# Patient Record
Sex: Female | Born: 1970
Health system: Southern US, Community
[De-identification: ages and names within clinical notes are randomized; demographics above are authoritative.]

## PROBLEM LIST (undated history)

## (undated) DIAGNOSIS — Z9289 Personal history of other medical treatment: Secondary | ICD-10-CM

## (undated) DIAGNOSIS — D649 Anemia, unspecified: Secondary | ICD-10-CM

## (undated) DIAGNOSIS — E282 Polycystic ovarian syndrome: Secondary | ICD-10-CM

## (undated) DIAGNOSIS — I10 Essential (primary) hypertension: Secondary | ICD-10-CM

## (undated) DIAGNOSIS — C801 Malignant (primary) neoplasm, unspecified: Secondary | ICD-10-CM

## (undated) DIAGNOSIS — C541 Malignant neoplasm of endometrium: Secondary | ICD-10-CM

## (undated) DIAGNOSIS — Z8489 Family history of other specified conditions: Secondary | ICD-10-CM

## (undated) HISTORY — DX: Malignant (primary) neoplasm, unspecified: C80.1

## (undated) HISTORY — PX: WISDOM TOOTH EXTRACTION: SHX21

## (undated) HISTORY — PX: CORNEAL TRANSPLANT: SHX108

## (undated) HISTORY — DX: Malignant neoplasm of endometrium: C54.1

## (undated) HISTORY — PX: CHOLECYSTECTOMY: SHX55

---

## 2009-06-29 ENCOUNTER — Observation Stay (HOSPITAL_COMMUNITY): Admission: EM | Admit: 2009-06-29 | Discharge: 2009-06-29 | Payer: Self-pay | Admitting: Emergency Medicine

## 2009-09-01 ENCOUNTER — Encounter (HOSPITAL_BASED_OUTPATIENT_CLINIC_OR_DEPARTMENT_OTHER): Admission: RE | Admit: 2009-09-01 | Discharge: 2009-11-30 | Payer: Self-pay | Admitting: General Surgery

## 2009-10-24 ENCOUNTER — Ambulatory Visit: Payer: Self-pay | Admitting: Surgery

## 2009-10-24 ENCOUNTER — Ambulatory Visit: Admission: RE | Admit: 2009-10-24 | Discharge: 2009-10-24 | Payer: Self-pay | Admitting: General Surgery

## 2009-10-25 ENCOUNTER — Encounter (HOSPITAL_BASED_OUTPATIENT_CLINIC_OR_DEPARTMENT_OTHER): Payer: Self-pay | Admitting: General Surgery

## 2009-12-04 ENCOUNTER — Encounter (HOSPITAL_BASED_OUTPATIENT_CLINIC_OR_DEPARTMENT_OTHER): Admission: RE | Admit: 2009-12-04 | Discharge: 2010-01-17 | Payer: Self-pay | Admitting: Internal Medicine

## 2010-11-28 LAB — POCT I-STAT, CHEM 8
Creatinine, Ser: 0.4 mg/dL (ref 0.4–1.2)
HCT: 46 % (ref 36.0–46.0)
Hemoglobin: 15.6 g/dL — ABNORMAL HIGH (ref 12.0–15.0)
Potassium: 3.6 mEq/L (ref 3.5–5.1)
Sodium: 141 mEq/L (ref 135–145)
TCO2: 25 mmol/L (ref 0–100)

## 2010-11-28 LAB — DIFFERENTIAL
Basophils Absolute: 0 10*3/uL (ref 0.0–0.1)
Basophils Relative: 0 % (ref 0–1)
Eosinophils Absolute: 0.1 10*3/uL (ref 0.0–0.7)
Eosinophils Relative: 1 % (ref 0–5)
Lymphs Abs: 1.7 10*3/uL (ref 0.7–4.0)
Neutrophils Relative %: 72 % (ref 43–77)

## 2010-11-28 LAB — CBC
HCT: 43.6 % (ref 36.0–46.0)
MCHC: 33.9 g/dL (ref 30.0–36.0)
MCV: 87.5 fL (ref 78.0–100.0)
Platelets: 247 10*3/uL (ref 150–400)
RDW: 13.5 % (ref 11.5–15.5)
WBC: 8.9 10*3/uL (ref 4.0–10.5)

## 2010-11-28 LAB — GLUCOSE, CAPILLARY: Glucose-Capillary: 118 mg/dL — ABNORMAL HIGH (ref 70–99)

## 2014-04-26 ENCOUNTER — Encounter (HOSPITAL_COMMUNITY): Payer: Self-pay | Admitting: Emergency Medicine

## 2014-04-26 ENCOUNTER — Emergency Department (HOSPITAL_COMMUNITY): Payer: BC Managed Care – PPO

## 2014-04-26 ENCOUNTER — Inpatient Hospital Stay (HOSPITAL_COMMUNITY)
Admission: EM | Admit: 2014-04-26 | Discharge: 2014-04-28 | DRG: 812 | Disposition: A | Discharge status: Home / Self Care | Payer: BC Managed Care – PPO | Attending: Family Medicine | Admitting: Family Medicine

## 2014-04-26 DIAGNOSIS — N920 Excessive and frequent menstruation with regular cycle: Secondary | ICD-10-CM | POA: Diagnosis present

## 2014-04-26 DIAGNOSIS — Z9289 Personal history of other medical treatment: Secondary | ICD-10-CM

## 2014-04-26 DIAGNOSIS — I872 Venous insufficiency (chronic) (peripheral): Secondary | ICD-10-CM

## 2014-04-26 DIAGNOSIS — D649 Anemia, unspecified: Secondary | ICD-10-CM

## 2014-04-26 DIAGNOSIS — Z803 Family history of malignant neoplasm of breast: Secondary | ICD-10-CM

## 2014-04-26 DIAGNOSIS — D630 Anemia in neoplastic disease: Secondary | ICD-10-CM | POA: Diagnosis present

## 2014-04-26 DIAGNOSIS — D62 Acute posthemorrhagic anemia: Principal | ICD-10-CM | POA: Diagnosis present

## 2014-04-26 DIAGNOSIS — I10 Essential (primary) hypertension: Secondary | ICD-10-CM | POA: Diagnosis present

## 2014-04-26 DIAGNOSIS — Z8249 Family history of ischemic heart disease and other diseases of the circulatory system: Secondary | ICD-10-CM

## 2014-04-26 DIAGNOSIS — I498 Other specified cardiac arrhythmias: Secondary | ICD-10-CM | POA: Diagnosis present

## 2014-04-26 DIAGNOSIS — E282 Polycystic ovarian syndrome: Secondary | ICD-10-CM | POA: Diagnosis present

## 2014-04-26 DIAGNOSIS — R0789 Other chest pain: Secondary | ICD-10-CM

## 2014-04-26 DIAGNOSIS — Z6841 Body Mass Index (BMI) 40.0 and over, adult: Secondary | ICD-10-CM

## 2014-04-26 HISTORY — DX: Personal history of other medical treatment: Z92.89

## 2014-04-26 HISTORY — DX: Essential (primary) hypertension: I10

## 2014-04-26 LAB — COMPREHENSIVE METABOLIC PANEL
ALBUMIN: 3.3 g/dL — AB (ref 3.5–5.2)
ALT: 7 U/L (ref 0–35)
ANION GAP: 16 — AB (ref 5–15)
AST: 10 U/L (ref 0–37)
Alkaline Phosphatase: 51 U/L (ref 39–117)
BILIRUBIN TOTAL: 0.3 mg/dL (ref 0.3–1.2)
BUN: 13 mg/dL (ref 6–23)
CHLORIDE: 104 meq/L (ref 96–112)
CO2: 21 mEq/L (ref 19–32)
CREATININE: 1 mg/dL (ref 0.50–1.10)
Calcium: 8.6 mg/dL (ref 8.4–10.5)
GFR, EST AFRICAN AMERICAN: 79 mL/min — AB (ref >90)
GFR, EST NON AFRICAN AMERICAN: 68 mL/min — AB (ref >90)
Glucose, Bld: 118 mg/dL — ABNORMAL HIGH (ref 70–99)
Potassium: 3.9 mEq/L (ref 3.7–5.3)
Sodium: 141 mEq/L (ref 137–147)
Total Protein: 6.7 g/dL (ref 6.0–8.3)

## 2014-04-26 LAB — CBC
HEMATOCRIT: 16.4 % — AB (ref 36.0–46.0)
Hemoglobin: 4.2 g/dL — CL (ref 12.0–15.0)
MCH: 17 pg — ABNORMAL LOW (ref 26.0–34.0)
MCHC: 25.6 g/dL — ABNORMAL LOW (ref 30.0–36.0)
MCV: 66.4 fL — AB (ref 78.0–100.0)
Platelets: 188 10*3/uL (ref 150–400)
RBC: 2.47 MIL/uL — ABNORMAL LOW (ref 3.87–5.11)
RDW: 19.9 % — AB (ref 11.5–15.5)
WBC: 9.3 10*3/uL (ref 4.0–10.5)

## 2014-04-26 LAB — PROTIME-INR
INR: 1.19 (ref 0.00–1.49)
Prothrombin Time: 15.1 seconds (ref 11.6–15.2)

## 2014-04-26 LAB — RETICULOCYTES
RBC.: 2.49 MIL/uL — ABNORMAL LOW (ref 3.87–5.11)
RETIC COUNT ABSOLUTE: 84.7 10*3/uL (ref 19.0–186.0)
Retic Ct Pct: 3.4 % — ABNORMAL HIGH (ref 0.4–3.1)

## 2014-04-26 LAB — I-STAT TROPONIN, ED: TROPONIN I, POC: 0 ng/mL (ref 0.00–0.08)

## 2014-04-26 LAB — PREPARE RBC (CROSSMATCH)

## 2014-04-26 LAB — ABO/RH: ABO/RH(D): A POS

## 2014-04-26 LAB — POC OCCULT BLOOD, ED: Fecal Occult Bld: POSITIVE — AB

## 2014-04-26 LAB — APTT: APTT: 29 s (ref 24–37)

## 2014-04-26 MED ORDER — FUROSEMIDE 10 MG/ML IJ SOLN
20.0000 mg | Freq: Once | INTRAMUSCULAR | Status: AC
  Administered 2014-04-27: 20 mg via INTRAVENOUS
  Filled 2014-04-26: qty 2

## 2014-04-26 MED ORDER — SODIUM CHLORIDE 0.9 % IV SOLN
Freq: Once | INTRAVENOUS | Status: AC
  Administered 2014-04-26: 23:00:00 via INTRAVENOUS

## 2014-04-26 MED ORDER — ONDANSETRON HCL 4 MG PO TABS: 4.0000 mg | ORAL_TABLET | Freq: Four times a day (QID) | ORAL | Status: DC | PRN

## 2014-04-26 MED ORDER — SENNOSIDES-DOCUSATE SODIUM 8.6-50 MG PO TABS
1.0000 | ORAL_TABLET | Freq: Every evening | ORAL | Status: DC | PRN
  Filled 2014-04-26: qty 1

## 2014-04-26 MED ORDER — ONDANSETRON HCL 4 MG/2ML IJ SOLN: 4.0000 mg | Freq: Four times a day (QID) | INTRAMUSCULAR | Status: DC | PRN

## 2014-04-26 NOTE — H&P (Signed)
[facility] Admission History and Physical Service Pager: [phone]  Patient name: Alexandra Morales Medical record number: [id number] Date of birth: February 07, 1971 Age: 43 y.o. Gender: female  Primary Care Provider: No PCP Per Patient Consultants: none Code Status: full   Chief Complaint: acute anemia   Assessment and Plan: Alexandra Morales is a 43 y.o. female presenting with anemia . PMH is significant for HTN.   #Microcytic anemia: Most likely acute blood loss associated with Menometrorrhagia. Reports menstrual cycles alternating in 3 months intervals. They are usually heavy but this has been the heaviest it has been. Hemoglobin of 4.2 with only comparison of 15. Patient with positive Hemoccult stool card. Reports of bright red blood with straining with stool. Denies any melena or hemoptysis. - Admitted to telemetry, Dr. Mingo Amber attending - Type and screen - Transfuse 3 units red blood cells - Post transfusion CBC - Lasix between transfusions - Iron and TIBC  - Ferritin - PT/INR, APTT  #Chest tightness/flow murmur/sinus tachycardia: Most likely associated with severe anemia. Istat trop negative and EKG showing sinus tachycardia. Patient is otherwise asymptomatic while lying down. -Cycle troponins x2 - TSH, hemoglobin A1c,  #Morbid obesity: BMI 74.4 -Consult to nutrition  #Lower extremity edema: Most likely related to venous insufficiency. No signs of fluid overload on chest x-ray area and no dyspnea. Only occurs at the end of the day while she is walking around at work. -Compression hoses -Elevate legs  FEN/GI: heart healthy diet, receiving blood  Prophylaxis: SCD's   Disposition: admitted to telemetry for PRC's.   History of Present Illness: Alexandra Morales is a 43 y.o. female presenting with dizziness and chest tightness. Her symptoms started on Sunday where she was becoming dizzy. Her dizziness only occurred when she was up waking around. Her symptoms  relieving upon sitting down. She had associated chest tightness only when walking around and relieved with rest. She's never had this chest tightness before. She denies any diaphoresis or nausea or vomiting. Father passed away at the age of 23 from heart attack.  She notes that she had a heavier menstrual cycle this past time and ordinary. It started last Wednesday and ended on Sunday. She is using roughly 2 pads an hour for 4 consecutive days. Normally she uses 3 pads per day during a normal menstrual cycle. She has never been pregnant. Her regular menstrual cycle is having a period for 3 months, no period for 3 months, period for 3 months and then no period for 3 months. She denies any abdominal or pelvic pain. She has never had a pelvic exam. Mother passed away from breast cancer.  She denies any hemoptysis. She denies any melena but does endorse prior red blood per act him. Red blood only occurs with stools. She denies any constipation or diarrhea.  Patient evaluated in the ED and found to have a hemoglobin of 4.2. Currently asymptomatic in the ED but is lying down.  Review Of Systems: Per HPI with the following additions: No headache, no abdominal pain, change in urination, see history of present illness Otherwise 12 point review of systems was performed and was unremarkable.  There are no active problems to display for this patient.  Past Medical History: Past Medical History  Diagnosis Date  . Hypertension    Past Surgical History: Past Surgical History  Procedure Laterality Date  . Cholecystectomy     Social History: History  Substance Use Topics  . Smoking status: Never Smoker   . Smokeless tobacco:  Not on file  . Alcohol Use: No   Additional social history: Never smoked, occasionally drink alcohol, never used illicit drugs Please also refer to relevant sections of EMR.  Family History: No family history on file. Allergies and Medications: No Known Allergies No current  facility-administered medications on file prior to encounter.   No current outpatient prescriptions on file prior to encounter.    Objective: BP 128/51  Pulse 99  Temp(Src) 98.5 F (36.9 C)  Resp 22  Ht 5\' 6"  (1.676 m)  Wt 460 lb (208.655 kg)  BMI 74.28 kg/m2  SpO2 92%  LMP 04/20/2014 Exam: General: NAD, alert, morbidly obese, pallor  HEENT: pale conjunctiva, [place]/AT,  Cardiovascular: tachycardic, murmur, S1S2,  Respiratory: CTAB, no wheezing or crackles, no extra effort of breathing  Abdomen: protuberant abdomen, soft, no hepatosplenomegaly, bowel sounds present Extremities: +2 pitting edema in to the knees bilaterally, venous stasis ulcer on the posterior right lower leg, no oozing or draining. Rectal: No external hemorrhoids noticed, no internal hemorrhoids palpated, no signs of anal fissure, dry blood noticed on skin and pelvic region Skin: No rashes Neuro: No gross deficits  Labs and Imaging: CBC BMET   Recent Labs Lab 04/26/14 1919  WBC 9.3  HGB 4.2*  HCT 16.4*  PLT 188    Recent Labs Lab 04/26/14 1919  NA 141  K 3.9  CL 104  CO2 21  BUN 13  CREATININE 1.00  GLUCOSE 118*  CALCIUM 8.6     EKG: sinus tachycardia   Chest x-ray IMPRESSION:  1. Stable cardiomegaly. No acute disease.  Alexandra Ax, MD 04/26/2014, 9:22 PM PGY-2, [place] Intern pager: [phone], text pages welcome

## 2014-04-26 NOTE — ED Provider Notes (Signed)
CSN: [id number]     Arrival date & time 04/26/14  1852 History   First MD Initiated Contact with Patient 04/26/14 1954     Chief Complaint  Patient presents with  . Chest Pain     (Consider location/radiation/quality/duration/timing/severity/associated sxs/prior Treatment) HPI Complains of generalized weakness, dyspnea on exertion and chest discomfort with exertion described as anterior tightness onset 2 days ago. Symptoms improved with rest. No treatment prior to coming here. No other associated symptoms. She is presently asymptomatic. She admits to heavy menses. Denies blood per rectum.  Past Medical History  Diagnosis Date  . Hypertension    Past Surgical History  Procedure Laterality Date  . Cholecystectomy     No family history on file. History  Substance Use Topics  . Smoking status: Never Smoker   . Smokeless tobacco: Not on file  . Alcohol Use: No   OB History   Grav Para Term Preterm Abortions TAB SAB Ect Mult Living                 Review of Systems  Constitutional: Positive for fatigue.  HENT: Negative.   Respiratory: Positive for shortness of breath.   Cardiovascular: Positive for chest pain.  Gastrointestinal: Negative.   Genitourinary:       Heavy menses  Musculoskeletal: Negative.   Skin: Negative.   Neurological: Negative.   Psychiatric/Behavioral: Negative.   All other systems reviewed and are negative.     Allergies  Review of patient's allergies indicates no known allergies.  Home Medications   Prior to Admission medications   Not on File   BP 128/51  Pulse 99  Temp(Src) 98.5 F (36.9 C)  Resp 22  Ht 5\' 6"  (1.676 m)  Wt 460 lb (208.655 kg)  BMI 74.28 kg/m2  SpO2 92%  LMP 04/20/2014 Physical Exam  Nursing note and vitals reviewed. Constitutional: She appears well-developed and well-nourished.  HENT:  Head: Normocephalic and atraumatic.  Eyes: Conjunctivae are normal. Pupils are equal, round, and reactive to light.  Neck: Neck  supple. No tracheal deviation present. No thyromegaly present.  Cardiovascular: Normal rate and regular rhythm.   No murmur heard. Pulmonary/Chest: Effort normal and breath sounds normal.  Abdominal: Soft. Bowel sounds are normal. She exhibits no distension. There is no tenderness.  Morbidly obese  Musculoskeletal: Normal range of motion. She exhibits no edema and no tenderness.  Neurological: She is alert. Coordination normal.  Skin: Skin is warm and dry. No rash noted.  Psychiatric: She has a normal mood and affect.    ED Course  Procedures (including critical care time) Labs Review Labs Reviewed  CBC - Abnormal; Notable for the following:    RBC 2.47 (*)    Hemoglobin 4.2 (*)    HCT 16.4 (*)    MCV 66.4 (*)    MCH 17.0 (*)    MCHC 25.6 (*)    RDW 19.9 (*)    All other components within normal limits  COMPREHENSIVE METABOLIC PANEL - Abnormal; Notable for the following:    Glucose, Bld 118 (*)    Albumin 3.3 (*)    GFR calc non Af Amer 68 (*)    GFR calc Af Amer 79 (*)    Anion gap 16 (*)    All other components within normal limits  I-STAT TROPOININ, ED    Imaging Review Dg Chest 2 View  04/26/2014   CLINICAL DATA:  CHEST PAIN CHEST PAIN a  EXAM: CHEST - 2 VIEW  COMPARISON:  06/28/2009  FINDINGS: Stable cardiomegaly.  Lungs clear.  No pneumothorax.  No effusion. Visualized skeletal structures are unremarkable.  IMPRESSION: 1. Stable cardiomegaly.  No acute disease.   Electronically Signed   By: Arne Cleveland M.D.   On: 04/26/2014 20:27     EKG Interpretation   Date/Time:  Tuesday April 26 2014 19:12:21 EDT Ventricular Rate:  116 PR Interval:  152 QRS Duration: 92 QT Interval:  334 QTC Calculation: 464 R Axis:   58 Text Interpretation:  Sinus tachycardia Otherwise normal ECG No old  tracing to compare Confirmed by Winfred Leeds  MD, Fernie Grimm [phone]) on 04/26/2014  7:35:52 PM     Results for orders placed during the hospital encounter of 04/26/14  CBC      Result  Value Ref Range   WBC 9.3  4.0 - 10.5 K/uL   RBC 2.47 (*) 3.87 - 5.11 MIL/uL   Hemoglobin 4.2 (*) 12.0 - 15.0 g/dL   HCT 16.4 (*) 36.0 - 46.0 %   MCV 66.4 (*) 78.0 - 100.0 fL   MCH 17.0 (*) 26.0 - 34.0 pg   MCHC 25.6 (*) 30.0 - 36.0 g/dL   RDW 19.9 (*) 11.5 - 15.5 %   Platelets 188  150 - 400 K/uL  COMPREHENSIVE METABOLIC PANEL      Result Value Ref Range   Sodium 141  137 - 147 mEq/L   Potassium 3.9  3.7 - 5.3 mEq/L   Chloride 104  96 - 112 mEq/L   CO2 21  19 - 32 mEq/L   Glucose, Bld 118 (*) 70 - 99 mg/dL   BUN 13  6 - 23 mg/dL   Creatinine, Ser 1.00  0.50 - 1.10 mg/dL   Calcium 8.6  8.4 - 10.5 mg/dL   Total Protein 6.7  6.0 - 8.3 g/dL   Albumin 3.3 (*) 3.5 - 5.2 g/dL   AST 10  0 - 37 U/L   ALT 7  0 - 35 U/L   Alkaline Phosphatase 51  39 - 117 U/L   Total Bilirubin 0.3  0.3 - 1.2 mg/dL   GFR calc non Af Amer 68 (*) >90 mL/min   GFR calc Af Amer 79 (*) >90 mL/min   Anion gap 16 (*) 5 - 15  I-STAT TROPOININ, ED      Result Value Ref Range   Troponin i, poc 0.00  0.00 - 0.08 ng/mL   Comment 3            Dg Chest 2 View  04/26/2014   CLINICAL DATA:  CHEST PAIN CHEST PAIN a  EXAM: CHEST - 2 VIEW  COMPARISON:  06/28/2009  FINDINGS: Stable cardiomegaly.  Lungs clear.  No pneumothorax.  No effusion. Visualized skeletal structures are unremarkable.  IMPRESSION: 1. Stable cardiomegaly.  No acute disease.   Electronically Signed   By: Arne Cleveland M.D.   On: 04/26/2014 20:27    MDM  With Dr.Scmmitz plan 23 hour observation MedSurg floor Transfuse with packed red cells Diagnosis symptomatic anemia Final diagnoses:  None        Orlie Dakin, MD 04/26/14 2130

## 2014-04-26 NOTE — ED Notes (Signed)
Pt presents with chest tightness, shortness of breath, and dizziness since Sunday.  Pt noticed on Sunday that she was feeling short of breath and that her heart was racing- denies pain but admits to tightness in chest with activity.  Pt reports dizziness with standing and "feeling like the room is spinning".  Neuro exam negative.  Pt alert and oriented X 4.

## 2014-04-27 ENCOUNTER — Inpatient Hospital Stay (HOSPITAL_COMMUNITY): Payer: BC Managed Care – PPO

## 2014-04-27 DIAGNOSIS — I872 Venous insufficiency (chronic) (peripheral): Secondary | ICD-10-CM

## 2014-04-27 DIAGNOSIS — D62 Acute posthemorrhagic anemia: Secondary | ICD-10-CM | POA: Diagnosis present

## 2014-04-27 DIAGNOSIS — D649 Anemia, unspecified: Secondary | ICD-10-CM

## 2014-04-27 DIAGNOSIS — Z6841 Body Mass Index (BMI) 40.0 and over, adult: Secondary | ICD-10-CM | POA: Diagnosis not present

## 2014-04-27 DIAGNOSIS — I10 Essential (primary) hypertension: Secondary | ICD-10-CM | POA: Diagnosis present

## 2014-04-27 DIAGNOSIS — R0789 Other chest pain: Secondary | ICD-10-CM

## 2014-04-27 DIAGNOSIS — Z803 Family history of malignant neoplasm of breast: Secondary | ICD-10-CM | POA: Diagnosis not present

## 2014-04-27 DIAGNOSIS — I498 Other specified cardiac arrhythmias: Secondary | ICD-10-CM | POA: Diagnosis present

## 2014-04-27 DIAGNOSIS — Z8249 Family history of ischemic heart disease and other diseases of the circulatory system: Secondary | ICD-10-CM | POA: Diagnosis not present

## 2014-04-27 DIAGNOSIS — N920 Excessive and frequent menstruation with regular cycle: Secondary | ICD-10-CM | POA: Diagnosis present

## 2014-04-27 DIAGNOSIS — E282 Polycystic ovarian syndrome: Secondary | ICD-10-CM | POA: Diagnosis present

## 2014-04-27 LAB — PREGNANCY, URINE: PREG TEST UR: NEGATIVE

## 2014-04-27 LAB — TROPONIN I: Troponin I: <0.3 ng/mL (ref <0.30)

## 2014-04-27 LAB — CBC
HEMATOCRIT: 26.2 % — AB (ref 36.0–46.0)
HEMOGLOBIN: 7.8 g/dL — AB (ref 12.0–15.0)
MCH: 21.1 pg — AB (ref 26.0–34.0)
MCHC: 29.8 g/dL — ABNORMAL LOW (ref 30.0–36.0)
MCV: 71 fL — ABNORMAL LOW (ref 78.0–100.0)
Platelets: 214 10*3/uL (ref 150–400)
RBC: 3.69 MIL/uL — AB (ref 3.87–5.11)
RDW: 22.6 % — ABNORMAL HIGH (ref 11.5–15.5)
WBC: 11 10*3/uL — ABNORMAL HIGH (ref 4.0–10.5)

## 2014-04-27 LAB — IRON AND TIBC
IRON: 10 ug/dL — AB (ref 42–135)
SATURATION RATIOS: 2 % — AB (ref 20–55)
TIBC: 426 ug/dL (ref 250–470)
UIBC: 416 ug/dL — AB (ref 125–400)

## 2014-04-27 LAB — HEMOGLOBIN AND HEMATOCRIT, BLOOD
HCT: 20.3 % — ABNORMAL LOW (ref 36.0–46.0)
Hemoglobin: 5.9 g/dL — CL (ref 12.0–15.0)

## 2014-04-27 LAB — HEMOGLOBIN A1C
Hgb A1c MFr Bld: 5.7 % — ABNORMAL HIGH (ref <5.7)
MEAN PLASMA GLUCOSE: 117 mg/dL — AB (ref <117)

## 2014-04-27 LAB — TSH: TSH: 4.02 u[IU]/mL (ref 0.350–4.500)

## 2014-04-27 LAB — PREPARE RBC (CROSSMATCH)

## 2014-04-27 LAB — FERRITIN: Ferritin: 2 ng/mL — ABNORMAL LOW (ref 10–291)

## 2014-04-27 MED ORDER — PANTOPRAZOLE SODIUM 40 MG IV SOLR
40.0000 mg | Freq: Two times a day (BID) | INTRAVENOUS | Status: DC
  Administered 2014-04-27 – 2014-04-28 (×3): 40 mg via INTRAVENOUS
  Filled 2014-04-27 (×4): qty 40

## 2014-04-27 MED ORDER — SODIUM CHLORIDE 0.9 % IV SOLN
Freq: Once | INTRAVENOUS | Status: AC
Start: 2014-04-27 — End: 2014-04-27
  Administered 2014-04-27: 15:00:00 via INTRAVENOUS

## 2014-04-27 MED ORDER — PANTOPRAZOLE SODIUM 40 MG PO TBEC: 40.0000 mg | DELAYED_RELEASE_TABLET | Freq: Two times a day (BID) | ORAL | Status: DC

## 2014-04-27 NOTE — H&P (Signed)
FMTS Attending Admission Note: Alexandra Sabal MD Personal pager:  [phone] FPTS Service Pager:  [phone]  I  have seen and examined this patient, reviewed their chart. I have discussed this patient with the resident. I agree with the resident's findings, assessment and care plan.  Additionally:  43 yo morbidly obese F with PMH significant for irregular periods and HTN who presents with symptomatic anemia of past several day's duration.  Patient states she has had irregular periods for past 2-3 years, described as light and heavy cycles alternating with months of no menses.  Last week, started period.  Increased to heavier than usual bleeding 2-4 pads per hour.  Began having lightheadedness and chest tightness when she walked, relieved with rest.  Denies orthopnea or PND, now or in past.  Bleeding stopped Monday but lightheadedness persisted thus she came to ED.  FOund to have Hgb ~4.  Admitted to [facility].  Exam: Gen:  Obese, pleasant, NAD Heart: RRR Lungs:  Distant but clear Abd: NT Ext:  Chronic venous stasis changes noted posterior portions of both legs, worse at calves.  +2 edema BL  Imp/Plan: 1.  Anemia - previously symptomatic, reason for admission - seems to be secondary to menorrhagia.   - she denied any melena or hematochezia to me.  Possibly FOBT + from contamination during examination - receiving her last unit of blood now.  Await post-transfusion CBC  2.  Menorrhagia: - cause of #1.   - would suspect this is hormonal rather than structural bleeding - recommend [place] prior to DC as she has not followed up often with physicians outpatient.   - no weight loss, night sweats, or other concerns for malignancy  3.  Cardiomegaly: - Likely Stage I HF based on risk of obesity and HTN - can defer Echo to outpatient as no symptoms prior to anemic episode.  Denies orthopnea as above.  4.  Chest tightness - agree with ACS R/O  Alveda Reasons, MD 04/27/2014 8:03 AM

## 2014-04-27 NOTE — Progress Notes (Signed)
Family Medicine Teaching Service Daily Progress Note Intern Pager: [phone]  Patient name: Alexandra Morales Medical record number: [id number] Date of birth: 15-Jun-1971 Age: 43 y.o. Gender: female  Primary Care Provider: No PCP Per Patient Consultants: None Code Status: Full  Pt Overview and Major Events to Date:  04/26/14:  Admitted to Hospital. Received 3 units of blood.  Assessment and Plan: Mrs. Redel is a 43yo female with history of HTN presenting with dizziness and chest tightness secondary to anemia. #Microcytic Anemia -Secondary to acute blood loss secondary to Menometorrhagia -Transfused 3 units RBC and received Lasix between transfusions -Hemoglobin pending from today, 4.2 yesterday prior to transfusion  -Monitor CBC -Manual Retic Count- 84.7 -Labs Pending:  Iron, TIBC, Ferritin, PT/INR, APTT, Troponins, TSH, A1C, Pregnancy Test -CXR- stable cardiomegaly, no acute disease -+ FOBT -Resulted in Chest Tightness, Flow Murmur, and Sinus Tachycardia  -Initial Troponin negative, continue to cycle troponins and monitor  -Sinus Tachycardia -Follow-up Pelvic US  #Morbid Obesity -BMI=74.4 -Consult Nutrition  #Lower Extremity Edema -Secondary to venous insufficiency -Compression hose and elevate legs  FEN/GI: Heart Healthy Diet, Transfused 3units RBC PPx: SCDs  Disposition:  -Admitted to telemetry.  Subjective:  No acute complaints overnight.  States she is feeling much better than yesterday when she was admitted to the hospital.  Denies problems with appetite or urination.  No bowel movement since hospitalization.  Denies being sexually active.  She is not currently on birth control.  No recent history of blood in stool. Normally uses 3 pads/day during normal menstrual cycle.  Cycle normally alternates at three month intervals, with one period a month for three months and then no periods for three months.  Last menstrual periods started on Wednesday (04/20/14) and ended on Sunday  (04/24/14) and she was using approximately 2 pads an hour.  Objective: Temp:  [98.1 F (36.7 C)-99.4 F (37.4 C)] 98.3 F (36.8 C) (09/02 0800) Pulse Rate:  [80-106] 80 (09/02 0800) Resp:  [17-23] 18 (09/02 0800) BP: (128-175)/(41-68) 158/59 mmHg (09/02 0800) SpO2:  [92 %-100 %] 94 % (09/02 0723) Weight:  [460 lb (208.655 kg)-467 lb 9.5 oz (212.1 kg)] 467 lb 9.5 oz (212.1 kg) (09/02 8341) Physical Exam: General: 43yo female resting comfortably and in no apparent distress Cardiovascular: S1 and S2 noted; no murmurs; regular rate and rhythm Respiratory: Clear to auscultation bilaterally; no wheezing noted; no increased work of breathing Abdomen: Bowel sounds noted; no tenderness or masses to palpation; soft and nondistended Extremities: Edema noted bilaterally--patient states this is at baseline  Laboratory:  Recent Labs Lab 04/26/14 1919  WBC 9.3  HGB 4.2*  HCT 16.4*  PLT 188    Recent Labs Lab 04/26/14 1919  NA 141  K 3.9  CL 104  CO2 21  BUN 13  CREATININE 1.00  CALCIUM 8.6  PROT 6.7  BILITOT 0.3  ALKPHOS 51  ALT 7  AST 10  GLUCOSE 118*   Troponins- negative, still cycling and monitoring  Imaging/Diagnostic Tests: EKG- sinus tachycardia CXR- stable cardiomegaly  Lorna Few, DO 04/27/2014, 9:40 AM PGY-1, [place] Intern pager: [phone], text pages welcome

## 2014-04-27 NOTE — Progress Notes (Signed)
UR Completed.  [phone]  

## 2014-04-27 NOTE — Progress Notes (Signed)
FMTS Attending Note  The plan of care was discussed with the resident team. I agree with the assessment and plan as documented by the resident.   Demetries Coia MD 

## 2014-04-27 NOTE — Plan of Care (Cosign Needed)
Problem: Food- and Nutrition-Related Knowledge Deficit (NB-1.1) Goal: Nutrition education Formal process to instruct or train a patient/client in a skill or to impart knowledge to help patients/clients voluntarily manage or modify food choices and eating behavior to maintain or improve health. Outcome: Completed/Met Date Met:  04/27/14  RD consulted for nutrition education regarding weight loss.  Body mass index is 75.51 kg/(m^2). Pt meets criteria for morbid obesity based on current BMI.  RD provided "Weight Loss Tips" handout from the Academy of [employer] and [employer]etics. Emphasized the importance of serving sizes and provided examples of correct portions of common foods. Discussed importance of controlled and consistent intake throughout the day. Provided examples of ways to balance meals/snacks and encouraged intake of high-fiber, whole grain complex carbohydrates. Emphasized the importance of hydration with calorie-free beverages and limiting sugar-sweetened beverages. Encouraged pt to discuss physical activity options with physician. Teach back method used.  Expect fair compliance.  Pt reports adequate appetite and PO intake. Pt states that she likes plenty of non-starchy vegetables and fruits, she just needs to incorporate them more in her diet. Discussed MyPlate with patient, emphasizing half the plate should be non-starchy vegetables. Pt reports drinking water mostly with Simply Lemonades occasionally. Pt states that she is walking more with her new position and is more aware of her food choices. Shared information regarding outpatient diet education at [facility].  Current diet order is regular, patient is consuming approximately 100% of meals at this time. Labs and medications reviewed. No further nutrition interventions warranted at this time. RD contact information provided. If additional nutrition issues arise, please re-consult RD.  Clayton Bibles, [place], [place] Licensed Dietitian  Nutritionist Pager: [phone]

## 2014-04-28 ENCOUNTER — Other Ambulatory Visit: Payer: Self-pay | Admitting: Family Medicine

## 2014-04-28 DIAGNOSIS — E282 Polycystic ovarian syndrome: Secondary | ICD-10-CM

## 2014-04-28 DIAGNOSIS — D62 Acute posthemorrhagic anemia: Secondary | ICD-10-CM

## 2014-04-28 LAB — TYPE AND SCREEN
ABO/RH(D): A POS
ANTIBODY SCREEN: NEGATIVE
UNIT DIVISION: 0
UNIT DIVISION: 0
Unit division: 0
Unit division: 0
Unit division: 0

## 2014-04-28 LAB — CBC
HEMATOCRIT: 24.8 % — AB (ref 36.0–46.0)
HEMOGLOBIN: 7.2 g/dL — AB (ref 12.0–15.0)
MCH: 20.8 pg — AB (ref 26.0–34.0)
MCHC: 29 g/dL — AB (ref 30.0–36.0)
MCV: 71.7 fL — ABNORMAL LOW (ref 78.0–100.0)
Platelets: 211 10*3/uL (ref 150–400)
RBC: 3.46 MIL/uL — ABNORMAL LOW (ref 3.87–5.11)
RDW: 23.1 % — ABNORMAL HIGH (ref 11.5–15.5)
WBC: 7.2 10*3/uL (ref 4.0–10.5)

## 2014-04-28 LAB — BASIC METABOLIC PANEL
Anion gap: 14 (ref 5–15)
BUN: 11 mg/dL (ref 6–23)
CO2: 22 meq/L (ref 19–32)
Calcium: 8.6 mg/dL (ref 8.4–10.5)
Chloride: 105 mEq/L (ref 96–112)
Creatinine, Ser: 0.88 mg/dL (ref 0.50–1.10)
GFR calc Af Amer: >90 mL/min (ref >90)
GFR calc non Af Amer: 80 mL/min — ABNORMAL LOW (ref >90)
Glucose, Bld: 119 mg/dL — ABNORMAL HIGH (ref 70–99)
Potassium: 5 mEq/L (ref 3.7–5.3)
SODIUM: 141 meq/L (ref 137–147)

## 2014-04-28 LAB — LIPID PANEL
CHOLESTEROL: 140 mg/dL (ref 0–200)
HDL: 26 mg/dL — AB (ref >39)
LDL Cholesterol: 85 mg/dL (ref 0–99)
TRIGLYCERIDES: 145 mg/dL (ref <150)
Total CHOL/HDL Ratio: 5.4 RATIO
VLDL: 29 mg/dL (ref 0–40)

## 2014-04-28 LAB — PROTIME-INR
INR: 1.14 (ref 0.00–1.49)
Prothrombin Time: 14.6 seconds (ref 11.6–15.2)

## 2014-04-28 LAB — APTT: aPTT: 28 seconds (ref 24–37)

## 2014-04-28 MED ORDER — METFORMIN HCL 500 MG PO TABS: 500.0000 mg | ORAL_TABLET | Freq: Every day | ORAL | Status: DC

## 2014-04-28 MED ORDER — METFORMIN HCL 500 MG PO TABS
500.0000 mg | ORAL_TABLET | Freq: Every day | ORAL | Status: DC
  Administered 2014-04-28: 500 mg via ORAL
  Filled 2014-04-28: qty 1

## 2014-04-28 NOTE — Discharge Summary (Signed)
[facility] Discharge Summary  Patient name: Alexandra Morales Medical record number: [id number] Date of birth: 11/13/1970 Age: 43 y.o. Gender: female Date of Admission: 04/26/2014  Date of Discharge: 04/28/2014 Admitting Physician: Alveda Reasons, MD  Primary Care Provider: No PCP Per Patient Consultants: None  Indication for Hospitalization: Acute Blood Loss Anemia  Discharge Diagnoses/Problem List:   Blood Loss Anemia Polycystic Ovarian Syndrome  Disposition: Discharge to Home  Discharge Condition: Stable  Discharge Exam:  Filed Vitals:   04/28/14 0950  BP: 165/67  Pulse: 80  Temp: 98.3 F (36.8 C)  Resp: 18   Physical Exam:  General: 43yo female resting comfortably and in no apparent distress  Cardiovascular: S1 and S2 noted; no murmurs; regular rate and rhythm  Respiratory: Clear to auscultation bilaterally; no wheezing noted; no increased work of breathing  Abdomen: Bowel sounds noted; no tenderness or masses to palpation; soft and nondistended  Extremities: Edema noted bilaterally--patient states this is at baseline  Brief Hospital Course:   #Microcytic Anemia  - Pt. Was initially admitted with active, and ongoing menometrorrhagia. She reported menstrual cycles that alternate at 3 month intervals. She says that they are usually heavy, but that this is the heaviest her period has been. She has been using 3 pads/ hr. When she normally uses 2-3 pads / day. Her hemoglobin at initial workup was 4.2 with the only baseline hemoglobin available being 3 years previously and measuring at 15. She had a positive FOBT in the ED, and evident bright red blood on her perineum. She was then transfused a total of 5 units during her admission in order to bring her hemoglobin up to 7.8 during this admission. Her bleeding stopped without intervention. Transvaginal ultrasound revealed hyperechogenic focus in the endometrial cavity warranting further workup which can be  continued as an outpatient now that her hemoglobin has stabilized. She is now stable and ready for discharge.  -Secondary to acute blood loss secondary to Menometorrhagia   # Polycystic Ovarian Syndrom - Pt. Was found to have signs and symptoms consistent with PCOS. Started on Metformin during this hospitalization. Will need continued outpatient follow up.   # HTN - Pt. With elevated blood pressures during this admission. Systolic blood pressure found to be in the 160's. She will need outpatient therapeutic initiation and management. Did not initiate at this time given her acute blood loss anemia.    Issues for Follow Up:  1. Blood Loss Anemia 2/2 Menometrorragia - Will need endometrial biopsy for further workup of hyperechogenic endometrial focus. Follow up bleeding, and follow up CBC / Anemia.  2. PCOS - started on metformin. Taper up as tolerated.  3. HTN - follow up blood pressures. May need initiation of therapeutic regimen.   Significant Procedures: None  Significant Labs and Imaging:   Recent Labs Lab 04/26/14 1919 04/27/14 1004 04/27/14 2122 04/28/14 1322  WBC 9.3  --  11.0* 7.2  HGB 4.2* 5.9* 7.8* 7.2*  HCT 16.4* 20.3* 26.2* 24.8*  PLT 188  --  214 211    Recent Labs Lab 04/26/14 1919 04/28/14 0919  NA 141 141  K 3.9 5.0  CL 104 105  CO2 21 22  GLUCOSE 118* 119*  BUN 13 11  CREATININE 1.00 0.88  CALCIUM 8.6 8.6  ALKPHOS 51  --   AST 10  --   ALT 7  --   ALBUMIN 3.3*  --    Troponin - Negative x 3 Pt / INR - WNL  UPT - negative TSH - 4.02 Hgb A1C - 5.7  Retic Ct. - 3.4 (H) Iron / TIBC - Iron -10, TIBC - 426, Sat - 2, UIBC - 416, Ferritin - 2  Imaging/Diagnostic Tests:  EKG- sinus tachycardia  CXR- stable cardiomegaly   Results/Tests Pending at Time of Discharge: Von Willebrand Panel   Discharge Medications:    Medication List    ASK your doctor about these medications       naproxen sodium 220 MG tablet  Commonly known as:  ANAPROX  Take  440 mg by mouth 2 (two) times daily as needed. pain        Discharge Instructions: Please refer to Patient Instructions section of EMR for full details.  Patient was counseled important signs and symptoms that should prompt return to medical care, changes in medications, dietary instructions, activity restrictions, and follow up appointments.   Follow-Up Appointments:   Aquilla Hacker, MD 04/28/2014, 2:17 PM PGY-1, [place]

## 2014-04-28 NOTE — Discharge Summary (Signed)
FMTS Attending Note  I personally saw and evaluated the patient. The plan of care was discussed with the resident team. I agree with the assessment and plan as documented by the resident.   Patient asymptomatic currently, no current vaginal bleeding, patient has hypoechogenic focus on transvaginal US and will need outpatient endometrial biopsy. Will schedule follow up at [facility].   Dossie Arbour MD

## 2014-04-28 NOTE — Progress Notes (Signed)
Patient discharged to home with instructions, verbalized understanding. 

## 2014-04-28 NOTE — Discharge Instructions (Signed)
You have been admitted to the hospital because you were anemic from losing blood.   You will need to follow up at the [facility] at 9:30 am on Friday 04/29/2014 in order to get blood drawn to ensure that your anemia is not worsening after your discharge. This will be a blood draw only visit.   You will have a hospital follow up appointment with Dr. Gwendlyn Deutscher at the [facility] at 10:30 am on Tuesday 05/03/2014 in order to follow up on your symptoms and ensure that you continue to receive the care that you need.   You have been started on a new medication called Metformin. You will take one pill (500mg ) once daily with breakfast after discharge.   Thanks for letting us take care of you!  Paula Compton, MD Family Medicine PGY 1

## 2014-04-29 ENCOUNTER — Other Ambulatory Visit: Payer: BC Managed Care – PPO

## 2014-04-29 DIAGNOSIS — D62 Acute posthemorrhagic anemia: Secondary | ICD-10-CM

## 2014-04-29 NOTE — Progress Notes (Signed)
CBC WITH DIFF DONE TODAY Alexandra Morales 

## 2014-04-30 LAB — CBC WITH DIFFERENTIAL/PLATELET
Basophils Absolute: 0 10*3/uL (ref 0.0–0.1)
Basophils Relative: 0 % (ref 0–1)
EOS ABS: 0.2 10*3/uL (ref 0.0–0.7)
EOS PCT: 2 % (ref 0–5)
HCT: 25 % — ABNORMAL LOW (ref 36.0–46.0)
HEMOGLOBIN: 7.3 g/dL — AB (ref 12.0–15.0)
LYMPHS ABS: 1.5 10*3/uL (ref 0.7–4.0)
Lymphocytes Relative: 19 % (ref 12–46)
MCH: 20.3 pg — AB (ref 26.0–34.0)
MCHC: 29.2 g/dL — ABNORMAL LOW (ref 30.0–36.0)
MCV: 69.6 fL — AB (ref 78.0–100.0)
Monocytes Absolute: 0.6 10*3/uL (ref 0.1–1.0)
Monocytes Relative: 7 % (ref 3–12)
Neutro Abs: 5.8 10*3/uL (ref 1.7–7.7)
Neutrophils Relative %: 72 % (ref 43–77)
Platelets: 246 10*3/uL (ref 150–400)
RBC: 3.59 MIL/uL — AB (ref 3.87–5.11)
RDW: 25.9 % — ABNORMAL HIGH (ref 11.5–15.5)
WBC: 8.1 10*3/uL (ref 4.0–10.5)

## 2014-05-03 ENCOUNTER — Ambulatory Visit (INDEPENDENT_AMBULATORY_CARE_PROVIDER_SITE_OTHER): Payer: BC Managed Care – PPO | Admitting: Family Medicine

## 2014-05-03 ENCOUNTER — Encounter: Payer: Self-pay | Admitting: Family Medicine

## 2014-05-03 VITALS — BP 150/86 | HR 99 | Temp 98.5°F | Wt >= 6400 oz

## 2014-05-03 DIAGNOSIS — D62 Acute posthemorrhagic anemia: Secondary | ICD-10-CM | POA: Diagnosis not present

## 2014-05-03 DIAGNOSIS — R609 Edema, unspecified: Secondary | ICD-10-CM

## 2014-05-03 DIAGNOSIS — I10 Essential (primary) hypertension: Secondary | ICD-10-CM | POA: Diagnosis not present

## 2014-05-03 DIAGNOSIS — D500 Iron deficiency anemia secondary to blood loss (chronic): Secondary | ICD-10-CM

## 2014-05-03 LAB — VON WILLEBRAND PANEL
COAGULATION FACTOR VIII: 600 % — AB (ref 73–140)
RISTOCETIN CO-FACTOR, PLASMA: 228 % — AB (ref 42–200)
Von Willebrand Antigen, Plasma: 235 % — ABNORMAL HIGH (ref 50–217)

## 2014-05-03 MED ORDER — HYDROCHLOROTHIAZIDE 12.5 MG PO TABS
12.5000 mg | ORAL_TABLET | Freq: Every day | ORAL | Status: DC
Start: 2014-05-03 — End: 2014-07-13

## 2014-05-03 NOTE — Assessment & Plan Note (Signed)
Likely due to venous stasis. Leg elevation recommended. I restarted her back on Diuretics since she stated this had helped in the past.

## 2014-05-03 NOTE — Assessment & Plan Note (Signed)
Been on Diuretic and Lisinopril in the past. HCTZ started. Continue home BP check. Weight loss will help improve BP. F/U with PCP in 4 wks for reassessment.

## 2014-05-03 NOTE — Patient Instructions (Addendum)
It was nice seeing you today and I am glad you feel better since your hospital discharge. CBC was checked today, I will call you with result. I will also suggest you schedule appointment at our colposcopy clinic sometime this week or next for endometrial biopsy. Please start Iron 3 times daily since your iron level is quite low. If you have any question or concern, please give [place] a call.Since you have been on water pill in the past for your HTN, I will start you back on this which will also help with your BP.  Menorrhagia Menorrhagia is when your menstrual periods are heavy or last longer than usual.  HOME CARE  Only take medicine as told by your doctor.  Take any iron pills as told by your doctor. Heavy bleeding may cause low levels of iron in your body.  Do not take aspirin 1 week before or during your period. Aspirin can make the bleeding worse.  Lie down for a while if you change your tampon or pad more than once in 2 hours. This may help lessen the bleeding.  Eat a healthy diet and foods with iron. These foods include leafy green vegetables, meat, liver, eggs, and whole grain breads and cereals.  Do not try to lose weight. Wait until the heavy bleeding has stopped and your iron level is normal. GET HELP IF:  You soak through a pad or tampon every 1 or 2 hours, and this happens every time you have a period.  You need to use pads and tampons at the same time because you are bleeding so much.  You need to change your pad or tampon during the night.  You have a period that lasts for more than 8 days.  You pass clots bigger than 1 inch (2.5 cm) wide.  You have irregular periods that happen more or less often than once a month.  You feel dizzy or pass out (faint).  You feel very weak or tired.  You feel short of breath or feel your heart is beating too fast when you exercise.  You feel sick to your stomach (nausea) and you throw up (vomit) while you are taking your medicine.    You have watery poop (diarrhea) while you are taking your medicine.  You have any problems that may be related to the medicine you are taking.  GET HELP RIGHT AWAY IF:  You soak through 4 or more pads or tampons in 2 hours.  You have any bleeding while you are pregnant. MAKE SURE YOU:   Understand these instructions.  Will watch your condition.  Will get help right away if you are not doing well or get worse. Document Released: 05/21/2008 Document Revised: 04/14/2013 Document Reviewed: 02/11/2013 [facility] Patient Information 2015 [place], [place]. This information is not intended to replace advice given to you by your health care provider. Make sure you discuss any questions you have with your health care provider.

## 2014-05-03 NOTE — Progress Notes (Signed)
Subjective:     Patient ID: Alexandra Morales, female   DOB: [birth year], 43 y.o.   MRN: [id number]  HPI Anemia: Was recently discharge from the hospital s/p admission for severe anemia. She had blood transfusion. Since discharged from the hospital she denies any vaginal bleeding, no dizziness, feels weak on and off. No other concern. HTN: Here for follow up. She has hx of HTN for which she was on Lisinopril and one diuretic. She stopped medication 3 yrs ago due to lack of PCP follow up.  Edema;C/O B/L lower limb swelling on going for many years, denies any pain. Here for follow up.  Current Outpatient Prescriptions on File Prior to Visit  Medication Sig Dispense Refill  . metFORMIN (GLUCOPHAGE) 500 MG tablet Take 1 tablet (500 mg total) by mouth daily with breakfast.  30 tablet  2   No current facility-administered medications on file prior to visit.   Past Medical History  Diagnosis Date  . Hypertension       Review of Systems  Respiratory: Negative.   Cardiovascular: Negative.   Gastrointestinal: Negative.   Genitourinary: Negative.   Neurological: Negative.   All other systems reviewed and are negative.  Filed Vitals:   05/03/14 1054  BP: 150/86  Pulse: 99  Temp: 98.5 F (36.9 C)  TempSrc: Oral  Weight: 469 lb (212.737 kg)       Objective:   Physical Exam  Nursing note and vitals reviewed. Constitutional: She appears well-developed. No distress.  Cardiovascular: Normal rate, regular rhythm, normal heart sounds and intact distal pulses.   No murmur heard. Pulmonary/Chest: Effort normal and breath sounds normal. No respiratory distress. She has no wheezes. She exhibits no tenderness.  Abdominal: Soft. Bowel sounds are normal. She exhibits no distension and no mass. There is no tenderness.  Musculoskeletal: Normal range of motion. She exhibits edema.  B/L nonpitting edema       Assessment:     Anemia HTN Pedal edema     Plan:     Check problem list.

## 2014-05-03 NOTE — Assessment & Plan Note (Signed)
Secondary to menorrhagia. Hospital note reviewed, H/H improved after transfusion with a baseline of 7. CBC rechecked today, I will call her with result. I recommended Iron pills 3 times daily. I also referred to our Gyn/colposcopy clinic, she is instructed to schedule appointment on her way out. F/U with PCP for subsequent care.

## 2014-05-04 ENCOUNTER — Encounter: Payer: Self-pay | Admitting: *Deleted

## 2014-05-04 ENCOUNTER — Telehealth: Payer: Self-pay | Admitting: *Deleted

## 2014-05-04 LAB — CBC WITH DIFFERENTIAL/PLATELET
Basophils Absolute: 0 10*3/uL (ref 0.0–0.1)
Basophils Relative: 0 % (ref 0–1)
EOS PCT: 2 % (ref 0–5)
Eosinophils Absolute: 0.2 10*3/uL (ref 0.0–0.7)
HCT: 25.1 % — ABNORMAL LOW (ref 36.0–46.0)
Hemoglobin: 7.5 g/dL — ABNORMAL LOW (ref 12.0–15.0)
LYMPHS ABS: 1.3 10*3/uL (ref 0.7–4.0)
Lymphocytes Relative: 13 % (ref 12–46)
MCH: 21.2 pg — ABNORMAL LOW (ref 26.0–34.0)
MCHC: 29.9 g/dL — ABNORMAL LOW (ref 30.0–36.0)
MCV: 70.9 fL — AB (ref 78.0–100.0)
Monocytes Absolute: 0.5 10*3/uL (ref 0.1–1.0)
Monocytes Relative: 5 % (ref 3–12)
NEUTROS ABS: 7.9 10*3/uL — AB (ref 1.7–7.7)
Neutrophils Relative %: 80 % — ABNORMAL HIGH (ref 43–77)
PLATELETS: 306 10*3/uL (ref 150–400)
RBC: 3.54 MIL/uL — AB (ref 3.87–5.11)
RDW: 25.9 % — ABNORMAL HIGH (ref 11.5–15.5)
WBC: 9.9 10*3/uL (ref 4.0–10.5)

## 2014-05-04 NOTE — Telephone Encounter (Signed)
Message copied by Valerie Roys on Wed May 04, 2014  9:21 AM ------      Message from: Andrena Mews T      Created: Wed May 04, 2014  9:11 AM       Please call to inform patient, her hemoglobin is stable, continue iron pills as instructed and follow up for endometrial biopsy. ------

## 2014-05-04 NOTE — Telephone Encounter (Signed)
Unable to reach patient by phone.  Letter with results mailed.  With a reminder appt card.  Heavenly Christine,CMA

## 2014-05-12 ENCOUNTER — Ambulatory Visit: Payer: BC Managed Care – PPO

## 2014-05-26 ENCOUNTER — Ambulatory Visit (INDEPENDENT_AMBULATORY_CARE_PROVIDER_SITE_OTHER): Payer: BC Managed Care – PPO | Admitting: Family Medicine

## 2014-05-26 VITALS — BP 180/98 | HR 92 | Temp 98.2°F | Ht 66.0 in | Wt >= 6400 oz

## 2014-05-26 DIAGNOSIS — N921 Excessive and frequent menstruation with irregular cycle: Secondary | ICD-10-CM | POA: Diagnosis not present

## 2014-05-26 DIAGNOSIS — N939 Abnormal uterine and vaginal bleeding, unspecified: Secondary | ICD-10-CM | POA: Diagnosis not present

## 2014-05-26 DIAGNOSIS — D649 Anemia, unspecified: Secondary | ICD-10-CM | POA: Diagnosis not present

## 2014-05-26 NOTE — Assessment & Plan Note (Signed)
I reviewed her PUS and discussed by phone with Dr Harolyn Rutherford who recommendedwe proceed with obtaining tissue sample.  We attempted to perform endometrial biopsy. Unfortunately we were unable to get her positioned appropriately and were not able to insert speculum adequately secondary to her habitus. She will need a larger medical table (maybe one that tilts) and more assistance than we had available today inmy clinic toget this done. I have discussed with her and will refer her to GYN. Continue metformin. Marland Kitchen

## 2014-05-26 NOTE — Assessment & Plan Note (Signed)
I did not directly address this at today's visit as it was obviously an uncomfortable visit secondary to our inability to obtain the biopsy. I did urge her to f/u with her new PCP and perhaps she would be a good candidate for gastric bypass.

## 2014-05-26 NOTE — Progress Notes (Signed)
   Subjective:    Patient ID: Alexandra Morales, female    DOB: 1970-10-06, 43 y.o.   MRN: [id number]  HPI  Here for endometrial biopsy Hospitalized last month with Hgb 4. Thought to be related to menorrhagia. Had tnsf, work up for [employer] and has had pelvic US Never been sexually active with men, never had pap smear Menses typically irregular, heavy and last 6 or more days. The cycle immediately prior to her hospitalization lasted better part of 2 weeks---only time she has had that happen. On d/c from hospital was placed on metformin for presumed PCOS  PERTINENT  PMH / PSH: I have reviewed the patient's medications, allergies, past medical and surgical history. 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 Does have chronic LE edema.    Objective:   Physical Exam  GEN: BMI 75 GU: difficult to visualize externally An attemopt was made to insert speculum but positioning was so difficult that this was discontinued. EXT: chronic venous stasis changes..   IMAGING:Uterus: 11.4 x 7.9 x 9.5 cm. Poorly visualized/evaluated secondary  to patient body habitus.  Endometrium: Central echogenicity, which may represent endometrium.  Measures 4.2 cm.  Right Ovary: Not visualized.  Left Ovary: Not visualized.  Other Findings: No gross free pelvic fluid.  Markedly degraded exam secondary to patient body habitus.  IMPRESSION:  1. Markedly degraded exam, secondary to patient body habitus.  2. Prominent uterus with central hyperechogenicity. This could  represent endometrial thickening, indicative of hyperplasia or even  carcinoma. This is however, indeterminate. Potential clinical  strategies include tissue sampling or sonohysterogram (if feasible).    Contrast enhanced MRI (preferred) or CT would be alternate  possibilities in order to more entirely evaluate the uterus (if  clinically feasible).  3. Lack of visualization of the ovaries.   LABS:4wk ago   Coagulation Factor VIII 73 - 140 %  600 (H)     Ristocetin Co-factor, Plasma 42 - 200 %  228 (H)     Comments: (NOTE) RESULT HAS BEEN VERIFIED. Units: % of normal   Von Willebrand Antigen, Plasma 50 - 217 %  235    Results for Alexandra Morales, Alexandra Morales (MRN [id number]) as of 05/26/2014 16:17  Ref. Range 04/27/2014 10:04 04/27/2014 11:50 04/27/2014 12:57 04/27/2014 16:00 04/27/2014 21:22 04/28/2014 09:19 04/28/2014 10:59 04/28/2014 13:22 04/29/2014 09:03 05/03/2014 10:47  Hemoglobin Latest Range: 12.0-15.0 g/dL 5.9 (LL)    7.8 (L)   7.2 (L) 7.3 (L) 7.5 (L)      Assessment & Plan:

## 2014-05-27 ENCOUNTER — Encounter: Payer: Self-pay | Admitting: Family Medicine

## 2014-05-27 NOTE — Addendum Note (Signed)
Addended byDorcas Mcmurray L on: 05/27/2014 03:59 PM   Modules accepted: Orders

## 2014-05-27 NOTE — Progress Notes (Signed)
Patient ID: Alexandra Morales, female   DOB: 1971/05/10, 42 y.o.   MRN: [id number] Additional her for a necessary for patient to have endometrial biopsy outside GYN. She has an appointment for vaginal bleeding schedule but they need a referral for endometrial biopsy which I am putting in today.

## 2014-07-05 ENCOUNTER — Encounter: Payer: Self-pay | Admitting: Family Medicine

## 2014-07-05 ENCOUNTER — Ambulatory Visit (INDEPENDENT_AMBULATORY_CARE_PROVIDER_SITE_OTHER): Payer: BC Managed Care – PPO | Admitting: *Deleted

## 2014-07-05 ENCOUNTER — Ambulatory Visit (INDEPENDENT_AMBULATORY_CARE_PROVIDER_SITE_OTHER): Payer: BC Managed Care – PPO | Admitting: Family Medicine

## 2014-07-05 DIAGNOSIS — D649 Anemia, unspecified: Secondary | ICD-10-CM

## 2014-07-05 DIAGNOSIS — E282 Polycystic ovarian syndrome: Secondary | ICD-10-CM

## 2014-07-05 DIAGNOSIS — D500 Iron deficiency anemia secondary to blood loss (chronic): Secondary | ICD-10-CM

## 2014-07-05 DIAGNOSIS — Z23 Encounter for immunization: Secondary | ICD-10-CM

## 2014-07-05 DIAGNOSIS — I10 Essential (primary) hypertension: Secondary | ICD-10-CM

## 2014-07-05 MED ORDER — LISINOPRIL-HYDROCHLOROTHIAZIDE 10-12.5 MG PO TABS: 1.0000 | ORAL_TABLET | Freq: Every day | ORAL | Status: DC

## 2014-07-05 NOTE — Patient Instructions (Addendum)
Thanks for coming in today!   We will start you on a new blood pressure medication.   Please continue to check your blood pressure at home.   Schedule a lab appointment for one week at a time that is convenient for you.   Continue to work on the diet and exercise modifications that we discussed.   Thanks for letting us take care of you.   Sincerely,  Paula Compton, MD Family Medicine - PGY 1

## 2014-07-06 LAB — CBC WITH DIFFERENTIAL/PLATELET
Basophils Absolute: 0 10*3/uL (ref 0.0–0.1)
Basophils Relative: 0 % (ref 0–1)
Eosinophils Absolute: 0.3 10*3/uL (ref 0.0–0.7)
Eosinophils Relative: 3 % (ref 0–5)
HEMATOCRIT: 26.1 % — AB (ref 36.0–46.0)
HEMOGLOBIN: 7.4 g/dL — AB (ref 12.0–15.0)
LYMPHS ABS: 1.3 10*3/uL (ref 0.7–4.0)
LYMPHS PCT: 14 % (ref 12–46)
MCH: 21.4 pg — ABNORMAL LOW (ref 26.0–34.0)
MCHC: 28.4 g/dL — AB (ref 30.0–36.0)
MCV: 75.7 fL — ABNORMAL LOW (ref 78.0–100.0)
MONO ABS: 0.7 10*3/uL (ref 0.1–1.0)
MONOS PCT: 7 % (ref 3–12)
NEUTROS PCT: 76 % (ref 43–77)
Neutro Abs: 7.2 10*3/uL (ref 1.7–7.7)
Platelets: 378 10*3/uL (ref 150–400)
RBC: 3.45 MIL/uL — AB (ref 3.87–5.11)
RDW: 18.9 % — ABNORMAL HIGH (ref 11.5–15.5)
WBC: 9.5 10*3/uL (ref 4.0–10.5)

## 2014-07-06 LAB — TSH: TSH: 3.03 u[IU]/mL (ref 0.350–4.500)

## 2014-07-13 ENCOUNTER — Encounter: Payer: Self-pay | Admitting: Obstetrics & Gynecology

## 2014-07-13 ENCOUNTER — Ambulatory Visit (INDEPENDENT_AMBULATORY_CARE_PROVIDER_SITE_OTHER): Payer: BC Managed Care – PPO | Admitting: Obstetrics & Gynecology

## 2014-07-13 VITALS — BP 147/83 | HR 100 | Temp 100.1°F | Ht 67.0 in | Wt >= 6400 oz

## 2014-07-13 DIAGNOSIS — E282 Polycystic ovarian syndrome: Secondary | ICD-10-CM

## 2014-07-13 LAB — POCT URINE PREGNANCY: Preg Test, Ur: NEGATIVE

## 2014-07-13 MED ORDER — NORETHINDRONE ACETATE 5 MG PO TABS: 10.0000 mg | ORAL_TABLET | Freq: Every day | ORAL | Status: DC

## 2014-07-13 NOTE — Assessment & Plan Note (Signed)
A: Previously with symptomatic blood loss anemia. Reports that she has continued to bleed after discharge from the hospital. No symptoms of anemia at this time, and she is able to continue to work without a problem.   P:  - Check CBC today.  - Referral to OB/GYN for further evaluation of bleeding, and intervention.  - Needs EMB.

## 2014-07-13 NOTE — Progress Notes (Signed)
Patient ID: Alexandra Morales, female   DOB: 1970/10/04, 43 y.o.   MRN: [id number]   [facility] Aquilla Hacker, MD Phone: [phone]  Subjective:   # Vaginal Bleeding - Pt. Returns for evaluation of dysfunctional uterine bleeding. She was previously seen by Dr. Nori Riis at which time an attempt was made to get an endometrial biopsy. This attempt was unsuccessful, and she is scheduled to have a visit with OB/GYN at [facility] for a repeated attempt for EMB.  - Continues to bleed sporadically usually q 5 days she says that she will have an initial large amount of bleeding with subsequent tapering of her bleeding.  - She says that she has been bleeding on the day of this visit.  - She denies SOB, Chest pain, fatigue, or dizziness.  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   # Morbid Obesity   - pt. With BMI of 77. She reports that she has actually lost some weight before, however it has been difficult for her because she has been eating more fast food lately. She tries to park further from the store at her job, take the stairs, and walk whenever she has time to at home. However, she admits that time constraints and inability to consistently control her intake make it difficult for her to lose weight.     All relevant systems were reviewed and were negative unless otherwise noted in the HPI  Past Medical History Reviewed problem list.  Medications- reviewed and updated Current Outpatient Prescriptions  Medication Sig Dispense Refill  . lisinopril-hydrochlorothiazide (PRINZIDE,ZESTORETIC) 10-12.5 MG per tablet Take 1 tablet by mouth daily. 90 tablet 3  . metFORMIN (GLUCOPHAGE) 500 MG tablet Take 1 tablet (500 mg total) by mouth daily with breakfast. 30 tablet 2  . norethindrone (AYGESTIN) 5 MG tablet Take 2 tablets (10 mg total) by mouth daily. 120 tablet 2    No current facility-administered medications for this visit.   Chief complaint-noted No additions to family history Social history- patient is a non smoker  Objective: BP 216/77 mmHg  Pulse 105  Temp(Src) 98.1 F (36.7 C) (Oral)  Ht 5\' 6"  (1.676 m)  Wt 477 lb 8 oz (216.593 kg)  BMI 77.11 kg/m2  LMP 06/28/2014 Gen: NAD, alert, cooperative with exam, Morbidly Obese HEENT: NCAT, EOMI, PERRL, no thyromegaly, No LAD Neck: FROM, supple, no LAD CV: RRR, good S1/S2, no murmur, 2+ distal pulses Resp: CTABL, no wheezes, non-labored Abd: SNTND, BS present, no guarding or organomegaly Ext: No edema, warm, normal tone, moves UE/LE spontaneously Neuro: Alert and oriented, No gross deficits Skin: no rashes no lesions  Assessment/Plan: See problem based a/p

## 2014-07-13 NOTE — Patient Instructions (Addendum)
Dilation and Curettage or Vacuum Curettage, Care After Refer to this sheet in the next few weeks. These instructions provide you with information on caring for yourself after your procedure. Your health care provider may also give you more specific instructions. Your treatment has been planned according to current medical practices, but problems sometimes occur. Call your health care provider if you have any problems or questions after your procedure. WHAT TO EXPECT AFTER THE PROCEDURE After your procedure, it is typical to have light cramping and bleeding. This may last for 2 days to 2 weeks after the procedure. HOME CARE INSTRUCTIONS   Do not drive for 24 hours.  Wait 1 week before returning to strenuous activities.  Take your temperature 2 times a day for 4 days and write it down. Provide these temperatures to your health care provider if you develop a fever.  Avoid long periods of standing.  Avoid heavy lifting, pushing, or pulling. Do not lift anything heavier than 10 pounds (4.5 kg).  Limit stair climbing to once or twice a day.  Take rest periods often.  You may resume your usual diet.  Drink enough fluids to keep your urine clear or pale yellow.  Your usual bowel function should return. If you have constipation, you may:  Take a mild laxative with permission from your health care provider.  Add fruit and bran to your diet.  Drink more fluids.  Take showers instead of baths until your health care provider gives you permission to take baths.  Do not go swimming or use a hot tub until your health care provider approves.  Try to have someone with you or available to you the first 24-48 hours, especially if you were given a general anesthetic.  Do not douche, use tampons, or have sex (intercourse) for 2 weeks after the procedure.  Only take over-the-counter or prescription medicines as directed by your health care provider. Do not take aspirin. It can cause  bleeding.  Follow up with your health care provider as directed. SEEK MEDICAL CARE IF:   You have increasing cramps or pain that is not relieved with medicine.  You have abdominal pain that does not seem to be related to the same area of earlier cramping and pain.  You have bad smelling vaginal discharge.  You have a rash.  You are having problems with any medicine. SEEK IMMEDIATE MEDICAL CARE IF:   You have bleeding that is heavier than a normal menstrual period.  You have a fever.  You have chest pain.  You have shortness of breath.  You feel dizzy or feel like fainting.  You pass out.  You have pain in your shoulder strap area.  You have heavy vaginal bleeding with or without blood clots. MAKE SURE YOU:   Understand these instructions.  Will watch your condition.  Will get help right away if you are not doing well or get worse. Document Released: 08/09/2000 Document Revised: 08/17/2013 Document Reviewed: 03/11/2013 [facility] Patient Information 2015 [place], [place]. This information is not intended to replace advice given to you by your health care provider. Make sure you discuss any questions you have with your health care provider. Levonorgestrel intrauterine device (IUD) What is this medicine? LEVONORGESTREL IUD (LEE voe nor jes trel) is a contraceptive (birth control) device. The device is placed inside the uterus by a healthcare professional. It is used to prevent pregnancy and can also be used to treat heavy bleeding that occurs during your period. Depending on the device,  it can be used for 3 to 5 years. This medicine may be used for other purposes; ask your health care provider or pharmacist if you have questions. COMMON BRAND NAME(S): Verda Cumins What should I tell my health care provider before I take this medicine? They need to know if you have any of these conditions: -abnormal Pap smear -cancer of the breast, uterus, or  cervix -diabetes -endometritis -genital or pelvic infection now or in the past -have more than one sexual partner or your partner has more than one partner -heart disease -history of an ectopic or tubal pregnancy -immune system problems -IUD in place -liver disease or tumor -problems with blood clots or take blood-thinners -use intravenous drugs -uterus of unusual shape -vaginal bleeding that has not been explained -an unusual or allergic reaction to levonorgestrel, other hormones, silicone, or polyethylene, medicines, foods, dyes, or preservatives -pregnant or trying to get pregnant -breast-feeding How should I use this medicine? This device is placed inside the uterus by a health care professional. Talk to your pediatrician regarding the use of this medicine in children. Special care may be needed. Overdosage: If you think you have taken too much of this medicine contact a poison control center or emergency room at once. NOTE: This medicine is only for you. Do not share this medicine with others. What if I miss a dose? This does not apply. What may interact with this medicine? Do not take this medicine with any of the following medications: -amprenavir -bosentan -fosamprenavir This medicine may also interact with the following medications: -aprepitant -barbiturate medicines for inducing sleep or treating seizures -bexarotene -griseofulvin -medicines to treat seizures like carbamazepine, ethotoin, felbamate, oxcarbazepine, phenytoin, topiramate -modafinil -pioglitazone -rifabutin -rifampin -rifapentine -some medicines to treat HIV infection like atazanavir, indinavir, lopinavir, nelfinavir, tipranavir, ritonavir -St. John's wort -warfarin This list may not describe all possible interactions. Give your health care provider a list of all the medicines, herbs, non-prescription drugs, or dietary supplements you use. Also tell them if you smoke, drink alcohol, or use illegal  drugs. Some items may interact with your medicine. What should I watch for while using this medicine? Visit your doctor or health care professional for regular check ups. See your doctor if you or your partner has sexual contact with others, becomes HIV positive, or gets a sexual transmitted disease. This product does not protect you against HIV infection (AIDS) or other sexually transmitted diseases. You can check the placement of the IUD yourself by reaching up to the top of your vagina with clean fingers to feel the threads. Do not pull on the threads. It is a good habit to check placement after each menstrual period. Call your doctor right away if you feel more of the IUD than just the threads or if you cannot feel the threads at all. The IUD may come out by itself. You may become pregnant if the device comes out. If you notice that the IUD has come out use a backup birth control method like condoms and call your health care provider. Using tampons will not change the position of the IUD and are okay to use during your period. What side effects may I notice from receiving this medicine? Side effects that you should report to your doctor or health care professional as soon as possible: -allergic reactions like skin rash, itching or hives, swelling of the face, lips, or tongue -fever, flu-like symptoms -genital sores -high blood pressure -no menstrual period for 6 weeks during use -pain,  swelling, warmth in the leg -pelvic pain or tenderness -severe or sudden headache -signs of pregnancy -stomach cramping -sudden shortness of breath -trouble with balance, talking, or walking -unusual vaginal bleeding, discharge -yellowing of the eyes or skin Side effects that usually do not require medical attention (report to your doctor or health care professional if they continue or are bothersome): -acne -breast pain -change in sex drive or performance -changes in weight -cramping, dizziness, or  faintness while the device is being inserted -headache -irregular menstrual bleeding within first 3 to 6 months of use -nausea This list may not describe all possible side effects. Call your doctor for medical advice about side effects. You may report side effects to FDA at 1-800-FDA-1088. Where should I keep my medicine? This does not apply. NOTE: This sheet is a summary. It may not cover all possible information. If you have questions about this medicine, talk to your doctor, pharmacist, or health care provider.  2015, [employer] Standard. (2011-09-12 13:54:04)

## 2014-07-13 NOTE — Assessment & Plan Note (Signed)
A: continues to have irregular menses. Weight gain continues to remain a problem. No overt signs of hirsutism at this time.   P:  - Continue Metformin - F/U recs from OB/GYN

## 2014-07-13 NOTE — Assessment & Plan Note (Signed)
A: Morbidly Obese at this point. She says that she has lost around 60lb before with dieting, however she says that she has continued to have significant intake and weight gain since that time. Her BMI continues to climb. She is parking further away from the store at work and walking in to work. She says that she has tried to eat healthier meals, but says that she has limited time at home to cook and is mostly working. Does not desire surgery at this point.   P:  - Discussed the importance of weight loss at this point for her.  - On Metformin for PCOS, but no frank insulin resistance yet.  - Discussed dieting and eating smaller portions / healthy frozen meals q 3 hours if she is unable to have time to prepare her own food.  - Discussed Exercise.  - I did discuss with her the value of surgery as an option to control her weight prior to seeing significant comorbidities as a result of her weight gain, however she is resistant to this at this point.
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
## 2014-07-19 ENCOUNTER — Encounter: Payer: Self-pay | Admitting: Family Medicine

## 2014-07-19 ENCOUNTER — Other Ambulatory Visit: Payer: Self-pay | Admitting: *Deleted

## 2014-08-15 ENCOUNTER — Telehealth: Payer: Self-pay | Admitting: *Deleted

## 2014-08-15 ENCOUNTER — Other Ambulatory Visit: Payer: Self-pay | Admitting: *Deleted

## 2014-08-15 ENCOUNTER — Encounter (HOSPITAL_COMMUNITY): Payer: Self-pay

## 2014-08-15 ENCOUNTER — Encounter (HOSPITAL_COMMUNITY)
Admission: RE | Admit: 2014-08-15 | Discharge: 2014-08-15 | Disposition: A | Discharge status: Home / Self Care | Payer: BC Managed Care – PPO | Source: Ambulatory Visit | Attending: Obstetrics & Gynecology | Admitting: Obstetrics & Gynecology

## 2014-08-15 DIAGNOSIS — Z01812 Encounter for preprocedural laboratory examination: Secondary | ICD-10-CM | POA: Diagnosis present

## 2014-08-15 HISTORY — DX: Personal history of other medical treatment: Z92.89

## 2014-08-15 HISTORY — DX: Anemia, unspecified: D64.9

## 2014-08-15 HISTORY — DX: Polycystic ovarian syndrome: E28.2

## 2014-08-15 LAB — ABO/RH: ABO/RH(D): A POS

## 2014-08-15 LAB — TYPE AND SCREEN
ABO/RH(D): A POS
ANTIBODY SCREEN: NEGATIVE

## 2014-08-15 LAB — BASIC METABOLIC PANEL
ANION GAP: 13 (ref 5–15)
BUN: 23 mg/dL (ref 6–23)
CHLORIDE: 106 meq/L (ref 96–112)
CO2: 21 meq/L (ref 19–32)
Calcium: 8.3 mg/dL — ABNORMAL LOW (ref 8.4–10.5)
Creatinine, Ser: 1.27 mg/dL — ABNORMAL HIGH (ref 0.50–1.10)
GFR calc Af Amer: 59 mL/min — ABNORMAL LOW (ref >90)
GFR calc non Af Amer: 51 mL/min — ABNORMAL LOW (ref >90)
Glucose, Bld: 118 mg/dL — ABNORMAL HIGH (ref 70–99)
POTASSIUM: 4.1 meq/L (ref 3.7–5.3)
SODIUM: 140 meq/L (ref 137–147)

## 2014-08-15 LAB — CBC
HEMATOCRIT: 25.5 % — AB (ref 36.0–46.0)
HEMOGLOBIN: 6.9 g/dL — AB (ref 12.0–15.0)
MCH: 19.2 pg — ABNORMAL LOW (ref 26.0–34.0)
MCHC: 27.1 g/dL — ABNORMAL LOW (ref 30.0–36.0)
MCV: 70.8 fL — ABNORMAL LOW (ref 78.0–100.0)
Platelets: 300 10*3/uL (ref 150–400)
RBC: 3.6 MIL/uL — ABNORMAL LOW (ref 3.87–5.11)
RDW: 18.4 % — AB (ref 11.5–15.5)
WBC: 8.3 10*3/uL (ref 4.0–10.5)

## 2014-08-15 MED ORDER — SODIUM CHLORIDE 0.9 % IV SOLN: Freq: Once | INTRAVENOUS | Status: DC

## 2014-08-15 NOTE — Patient Instructions (Addendum)
   Your procedure is scheduled on:  Monday, Dec 28  Enter through the Main Entrance of [facility] at:  11:30 AM Pick up the phone at the desk and dial [phone] and inform us of your arrival.  Please call this number if you have any problems the morning of surgery: [phone]  Remember: Do not eat food after midnight: Sunday Do not drink clear liquids after: 9 AM Monday, day of surgery Take these medicines the morning of surgery with a SIP OF WATER: Lisinopril-hctz.  Do Not take Metformin on Monday, day of surgery.  We will check your sugar upon arrival to Short Stay Department.  Do not wear jewelry, make-up, or FINGER nail polish No metal in your hair or on your body. Do not wear lotions, powders, perfumes.  You may wear deodorant.  Do not bring valuables to the hospital. Contacts, dentures or bridgework may not be worn into surgery.  Patients discharged on the day of surgery will not be allowed to drive home.  Home with [facility] cell [phone]

## 2014-08-15 NOTE — Pre-Procedure Instructions (Signed)
SDS BB History Log given to Lab for patient's history of blood transfusion 04/26/2014 - 5 units at [facility].

## 2014-08-15 NOTE — Pre-Procedure Instructions (Signed)
I left a VM message on Dr Anson Crofts nurses line and I also sent Dr Delsa Sale a message regarding low hgb and need for transfusion prior to surgery.

## 2014-08-15 NOTE — Pre-Procedure Instructions (Signed)
Dr Royce Macadamia informed that patient's hgb was 6.9 (low) at PAT appointment today.  Verbal order that patient will need to be transfused prior to surgery on 08/22/14.  Will notify Dr Lahoma Crocker.

## 2014-08-15 NOTE — Telephone Encounter (Signed)
Tried to call and notify patient of need for a blood transfusion before her surgery on Monday due to her hemoglobin being a 6.9. LM on VM to CB.

## 2014-08-16 ENCOUNTER — Other Ambulatory Visit: Payer: Self-pay | Admitting: Obstetrics

## 2014-08-16 NOTE — Telephone Encounter (Signed)
Patient notified- will check with Dr Delsa Sale regarding scheduling.

## 2014-08-17 ENCOUNTER — Other Ambulatory Visit: Payer: Self-pay | Admitting: *Deleted

## 2014-08-22 ENCOUNTER — Encounter: Payer: Self-pay | Admitting: *Deleted

## 2014-08-22 ENCOUNTER — Encounter (HOSPITAL_COMMUNITY): Payer: Self-pay | Admitting: *Deleted

## 2014-08-22 ENCOUNTER — Other Ambulatory Visit (HOSPITAL_COMMUNITY)
Admission: RE | Admit: 2014-08-22 | Discharge: 2014-08-22 | Disposition: A | Discharge status: Home / Self Care | Payer: BC Managed Care – PPO | Source: Ambulatory Visit | Attending: Obstetrics & Gynecology | Admitting: Obstetrics & Gynecology

## 2014-08-22 ENCOUNTER — Ambulatory Visit (HOSPITAL_COMMUNITY): Payer: BC Managed Care – PPO | Admitting: Anesthesiology

## 2014-08-22 ENCOUNTER — Ambulatory Visit (HOSPITAL_COMMUNITY)
Admission: RE | Admit: 2014-08-22 | Discharge: 2014-08-23 | Disposition: A | Discharge status: Home / Self Care | Payer: BC Managed Care – PPO | Source: Ambulatory Visit | Attending: Obstetrics & Gynecology | Admitting: Obstetrics & Gynecology

## 2014-08-22 ENCOUNTER — Encounter (HOSPITAL_COMMUNITY)
Admission: RE | Disposition: A | Discharge status: Home / Self Care | Payer: Self-pay | Source: Ambulatory Visit | Attending: Obstetrics & Gynecology

## 2014-08-22 DIAGNOSIS — Z1151 Encounter for screening for human papillomavirus (HPV): Secondary | ICD-10-CM | POA: Insufficient documentation

## 2014-08-22 DIAGNOSIS — C541 Malignant neoplasm of endometrium: Secondary | ICD-10-CM | POA: Diagnosis not present

## 2014-08-22 DIAGNOSIS — E282 Polycystic ovarian syndrome: Secondary | ICD-10-CM | POA: Diagnosis not present

## 2014-08-22 DIAGNOSIS — D509 Iron deficiency anemia, unspecified: Secondary | ICD-10-CM | POA: Insufficient documentation

## 2014-08-22 DIAGNOSIS — N926 Irregular menstruation, unspecified: Secondary | ICD-10-CM

## 2014-08-22 DIAGNOSIS — I10 Essential (primary) hypertension: Secondary | ICD-10-CM | POA: Diagnosis not present

## 2014-08-22 DIAGNOSIS — N939 Abnormal uterine and vaginal bleeding, unspecified: Secondary | ICD-10-CM | POA: Diagnosis present

## 2014-08-22 DIAGNOSIS — N938 Other specified abnormal uterine and vaginal bleeding: Secondary | ICD-10-CM | POA: Diagnosis present

## 2014-08-22 DIAGNOSIS — Z30430 Encounter for insertion of intrauterine contraceptive device: Secondary | ICD-10-CM

## 2014-08-22 HISTORY — PX: HYSTEROSCOPY WITH D & C: SHX1775

## 2014-08-22 LAB — CBC
HCT: 27.2 % — ABNORMAL LOW (ref 36.0–46.0)
Hemoglobin: 7.3 g/dL — ABNORMAL LOW (ref 12.0–15.0)
MCH: 19.5 pg — AB (ref 26.0–34.0)
MCHC: 26.8 g/dL — AB (ref 30.0–36.0)
MCV: 72.5 fL — AB (ref 78.0–100.0)
PLATELETS: 379 10*3/uL (ref 150–400)
RBC: 3.75 MIL/uL — ABNORMAL LOW (ref 3.87–5.11)
RDW: 20.2 % — AB (ref 11.5–15.5)
WBC: 11 10*3/uL — AB (ref 4.0–10.5)

## 2014-08-22 LAB — PREPARE RBC (CROSSMATCH)

## 2014-08-22 SURGERY — DILATATION AND CURETTAGE /HYSTEROSCOPY
Anesthesia: General

## 2014-08-22 MED ORDER — PHENYLEPHRINE 8 MG IN D5W 100 ML (0.08MG/ML) PREMIX OPTIME
INJECTION | INTRAVENOUS | Status: DC | PRN
  Administered 2014-08-22: 30 ug/min via INTRAVENOUS

## 2014-08-22 MED ORDER — PHENYLEPHRINE HCL 10 MG/ML IJ SOLN
INTRAMUSCULAR | Status: DC | PRN
  Administered 2014-08-22 (×2): 80 ug via INTRAVENOUS
  Administered 2014-08-22: 40 ug via INTRAVENOUS

## 2014-08-22 MED ORDER — PROMETHAZINE HCL 25 MG/ML IJ SOLN: 6.2500 mg | INTRAMUSCULAR | Status: DC | PRN

## 2014-08-22 MED ORDER — SODIUM CHLORIDE 0.9 % IV SOLN
INTRAVENOUS | Status: DC | PRN
  Administered 2014-08-22: 14:00:00 via INTRAVENOUS

## 2014-08-22 MED ORDER — KCL IN DEXTROSE-NACL 20-5-0.45 MEQ/L-%-% IV SOLN
INTRAVENOUS | Status: DC
  Administered 2014-08-22: 23:00:00 via INTRAVENOUS
  Filled 2014-08-22 (×2): qty 1000

## 2014-08-22 MED ORDER — FERROUS SULFATE 325 (65 FE) MG PO TABS
325.0000 mg | ORAL_TABLET | Freq: Three times a day (TID) | ORAL | Status: DC
  Administered 2014-08-22 – 2014-08-23 (×2): 325 mg via ORAL
  Filled 2014-08-22 (×2): qty 1

## 2014-08-22 MED ORDER — PROPOFOL 10 MG/ML IV EMUL
INTRAVENOUS | Status: AC
  Filled 2014-08-22: qty 20

## 2014-08-22 MED ORDER — ONDANSETRON HCL 4 MG/2ML IJ SOLN
INTRAMUSCULAR | Status: DC | PRN
  Administered 2014-08-22: 4 mg via INTRAVENOUS

## 2014-08-22 MED ORDER — SODIUM CHLORIDE 0.9 % IR SOLN
Status: DC | PRN
  Administered 2014-08-22: 1000 mL

## 2014-08-22 MED ORDER — ACETAMINOPHEN 160 MG/5ML PO SOLN: 325.0000 mg | ORAL | Status: DC | PRN

## 2014-08-22 MED ORDER — DEXAMETHASONE SODIUM PHOSPHATE 10 MG/ML IJ SOLN
INTRAMUSCULAR | Status: AC
Start: 2014-08-22 — End: 2014-08-22
  Filled 2014-08-22: qty 1

## 2014-08-22 MED ORDER — MEPERIDINE HCL 25 MG/ML IJ SOLN: 6.2500 mg | INTRAMUSCULAR | Status: DC | PRN

## 2014-08-22 MED ORDER — MIDAZOLAM HCL 2 MG/2ML IJ SOLN
INTRAMUSCULAR | Status: DC | PRN
  Administered 2014-08-22: 1 mg via INTRAVENOUS

## 2014-08-22 MED ORDER — ACETAMINOPHEN 325 MG PO TABS: 325.0000 mg | ORAL_TABLET | ORAL | Status: DC | PRN

## 2014-08-22 MED ORDER — LIDOCAINE HCL (CARDIAC) 20 MG/ML IV SOLN
INTRAVENOUS | Status: AC
  Filled 2014-08-22: qty 5

## 2014-08-22 MED ORDER — FENTANYL CITRATE 0.05 MG/ML IJ SOLN: 25.0000 ug | INTRAMUSCULAR | Status: DC | PRN

## 2014-08-22 MED ORDER — SCOPOLAMINE 1 MG/3DAYS TD PT72
MEDICATED_PATCH | TRANSDERMAL | Status: AC
  Filled 2014-08-22: qty 1

## 2014-08-22 MED ORDER — LACTATED RINGERS IV SOLN
INTRAVENOUS | Status: DC
  Administered 2014-08-22 (×4): via INTRAVENOUS

## 2014-08-22 MED ORDER — LISINOPRIL 10 MG PO TABS
10.0000 mg | ORAL_TABLET | Freq: Every day | ORAL | Status: DC
  Administered 2014-08-23: 10 mg via ORAL
  Filled 2014-08-22: qty 1

## 2014-08-22 MED ORDER — SODIUM CHLORIDE 0.9 % IV SOLN: 10.0000 mL/h | Freq: Once | INTRAVENOUS | Status: DC

## 2014-08-22 MED ORDER — ONDANSETRON HCL 4 MG/2ML IJ SOLN
INTRAMUSCULAR | Status: AC
  Filled 2014-08-22: qty 2

## 2014-08-22 MED ORDER — ONDANSETRON HCL 4 MG PO TABS: 4.0000 mg | ORAL_TABLET | Freq: Four times a day (QID) | ORAL | Status: DC | PRN

## 2014-08-22 MED ORDER — OXYCODONE-ACETAMINOPHEN 5-325 MG PO TABS
1.0000 | ORAL_TABLET | ORAL | Status: DC | PRN
  Administered 2014-08-22: 1 via ORAL
  Filled 2014-08-22: qty 1

## 2014-08-22 MED ORDER — SCOPOLAMINE 1 MG/3DAYS TD PT72
1.0000 | MEDICATED_PATCH | Freq: Once | TRANSDERMAL | Status: AC
  Administered 2014-08-22: 1.5 mg via TRANSDERMAL
  Administered 2014-08-22: 1 via TRANSDERMAL

## 2014-08-22 MED ORDER — GLYCOPYRROLATE 0.2 MG/ML IJ SOLN
INTRAMUSCULAR | Status: DC | PRN
  Administered 2014-08-22: 0.2 mg via INTRAVENOUS

## 2014-08-22 MED ORDER — LISINOPRIL-HYDROCHLOROTHIAZIDE 10-12.5 MG PO TABS: 1.0000 | ORAL_TABLET | Freq: Every day | ORAL | Status: DC

## 2014-08-22 MED ORDER — MIDAZOLAM HCL 2 MG/2ML IJ SOLN
0.5000 mg | Freq: Once | INTRAMUSCULAR | Status: DC | PRN
Start: 2014-08-22 — End: 2014-08-22

## 2014-08-22 MED ORDER — FENTANYL CITRATE 0.05 MG/ML IJ SOLN
INTRAMUSCULAR | Status: AC
  Filled 2014-08-22: qty 2

## 2014-08-22 MED ORDER — MIDAZOLAM HCL 2 MG/2ML IJ SOLN
INTRAMUSCULAR | Status: AC
  Filled 2014-08-22: qty 2

## 2014-08-22 MED ORDER — DEXAMETHASONE SODIUM PHOSPHATE 10 MG/ML IJ SOLN
INTRAMUSCULAR | Status: DC | PRN
  Administered 2014-08-22: 10 mg via INTRAVENOUS

## 2014-08-22 MED ORDER — FENTANYL CITRATE 0.05 MG/ML IJ SOLN
INTRAMUSCULAR | Status: DC | PRN
  Administered 2014-08-22: 25 ug via INTRAVENOUS

## 2014-08-22 MED ORDER — METFORMIN HCL 500 MG PO TABS
500.0000 mg | ORAL_TABLET | Freq: Every day | ORAL | Status: DC
  Administered 2014-08-23: 500 mg via ORAL
  Filled 2014-08-22: qty 1

## 2014-08-22 MED ORDER — LIDOCAINE HCL 1 % IJ SOLN
INTRAMUSCULAR | Status: DC | PRN
  Administered 2014-08-22: 10 mL

## 2014-08-22 MED ORDER — LIDOCAINE HCL 1 % IJ SOLN
INTRAMUSCULAR | Status: AC
  Filled 2014-08-22: qty 20

## 2014-08-22 MED ORDER — HYDROCHLOROTHIAZIDE 12.5 MG PO CAPS
12.5000 mg | ORAL_CAPSULE | Freq: Every day | ORAL | Status: DC
  Administered 2014-08-23: 12.5 mg via ORAL
  Filled 2014-08-22: qty 1

## 2014-08-22 MED ORDER — GLYCOPYRROLATE 0.2 MG/ML IJ SOLN
INTRAMUSCULAR | Status: AC
  Filled 2014-08-22: qty 1

## 2014-08-22 MED ORDER — PROPOFOL 10 MG/ML IV BOLUS
INTRAVENOUS | Status: DC | PRN
  Administered 2014-08-22: 350 mg via INTRAVENOUS
  Administered 2014-08-22: 50 mg via INTRAVENOUS

## 2014-08-22 MED ORDER — LIDOCAINE HCL (CARDIAC) 20 MG/ML IV SOLN
INTRAVENOUS | Status: DC | PRN
  Administered 2014-08-22: 100 mg via INTRAVENOUS

## 2014-08-22 MED ORDER — NORETHINDRONE ACETATE 5 MG PO TABS
15.0000 mg | ORAL_TABLET | Freq: Every day | ORAL | Status: DC
  Filled 2014-08-22: qty 3

## 2014-08-22 MED ORDER — ONDANSETRON HCL 4 MG/2ML IJ SOLN: 4.0000 mg | Freq: Four times a day (QID) | INTRAMUSCULAR | Status: DC | PRN

## 2014-08-22 SURGICAL SUPPLY — 19 items
CATH ROBINSON RED A/P 16FR (CATHETERS) ×3 IMPLANT
CLOTH BEACON ORANGE TIMEOUT ST (SAFETY) ×3 IMPLANT
DECANTER SPIKE VIAL GLASS SM (MISCELLANEOUS) ×3 IMPLANT
GLOVE BIO SURGEON STRL SZ 6.5 (GLOVE) ×3 IMPLANT
GOWN STRL REUS W/TWL LRG LVL3 (GOWN DISPOSABLE) ×6 IMPLANT
KIT BERKELEY 1ST TRIMESTER 3/8 (MISCELLANEOUS) IMPLANT
NS IRRIG 1000ML POUR BTL (IV SOLUTION) ×3 IMPLANT
PACK VAGINAL MINOR WOMEN LF (CUSTOM PROCEDURE TRAY) ×3 IMPLANT
PAD OB MATERNITY 4.3X12.25 (PERSONAL CARE ITEMS) ×3 IMPLANT
PAD PREP 24X48 CUFFED NSTRL (MISCELLANEOUS) ×3 IMPLANT
SCRUB PCMX 4 OZ (MISCELLANEOUS) ×3 IMPLANT
SET BERKELEY SUCTION TUBING (SUCTIONS) IMPLANT
SET TUBING AQUILEX (SET/KITS/TRAYS/PACK) ×3 IMPLANT
SLEEVE SCD COMPRESS KNEE LRG (MISCELLANEOUS) ×3 IMPLANT
TOWEL OR 17X24 6PK STRL BLUE (TOWEL DISPOSABLE) ×6 IMPLANT
VACURETTE 10 RIGID CVD (CANNULA) IMPLANT
VACURETTE 7MM CVD STRL WRAP (CANNULA) IMPLANT
VACURETTE 8 RIGID CVD (CANNULA) IMPLANT
VACURETTE 9 RIGID CVD (CANNULA) IMPLANT

## 2014-08-22 NOTE — Anesthesia Preprocedure Evaluation (Addendum)
Anesthesia Evaluation  Patient identified by MRN, date of birth, ID band Patient awake    Reviewed: Allergy & Precautions, H&P , Patient's Chart, lab work & pertinent test results, reviewed documented beta blocker date and time   History of Anesthesia Complications Negative for: history of anesthetic complications  Airway Mallampati: II  TM Distance: >3 FB Neck ROM: full    Dental   Pulmonary  breath sounds clear to auscultation        Cardiovascular Exercise Tolerance: Good hypertension, Rhythm:regular Rate:Normal     Neuro/Psych negative psych ROS   GI/Hepatic   Endo/Other  Morbid obesity  Renal/GU      Musculoskeletal   Abdominal   Peds  Hematology  (+) anemia ,   Anesthesia Other Findings   Reproductive/Obstetrics                           Anesthesia Physical Anesthesia Plan  ASA: IV  Anesthesia Plan: General LMA   Post-op Pain Management:    Induction:   Airway Management Planned:   Additional Equipment:   Intra-op Plan:   Post-operative Plan:   Informed Consent: I have reviewed the patients History and Physical, chart, labs and discussed the procedure including the risks, benefits and alternatives for the proposed anesthesia with the patient or authorized representative who has indicated his/her understanding and acceptance.   Dental Advisory Given  Plan Discussed with: CRNA, Surgeon and Anesthesiologist  Anesthesia Plan Comments:        Anesthesia Quick Evaluation

## 2014-08-22 NOTE — Op Note (Signed)
Preoperative diagnosis:  Abnormal uterine bleeding, thickened endometrium on ultrasound  Postoperative diagnosis: same  Procedure: Diagnostic hysteroscopy, dilatation and curettage, Pap smear, Mirena IUD insertion  Surgeon: Lahoma Crocker A  Anesthesia: general, paracervical block  Estimated blood loss: 200 ml  Urine output: 100 ml  IV Fluids: per Anesthesiology  Complications: None  Specimen: PATHOLOGY  Operative Findings: Poor visualization at hysteroscopy.  The endometrium was lush, polypoid.  Description of procedure:   The patient was taken to the operating room and placed on the operating table in the semi-lithotomy position in [place].  Examination under anesthesia was performed.  The patient was prepped and draped in the usual manner.  After a time-out had been completed, a speculum was placed in the vagina.  A Pap smear was obtained.  The anterior lip of the cervix was grasped with a single-toothed tenaculum.    10 cc of 1% lidocaine were injected at 4 and 7 o'clock to produce a paracervical block.  The uterine cavity sounded to 9 cm.  The endocervical canal was dilated with Kennon Rounds dilators.  A 5 mm diagnostic hysteroscope with Glycine as the distending medium was used to perform a diagnostic hysteroscopy.  The findings are noted above.    The hysteroscope was removed.  A small, Sims curette was used to perform an endometrial curettage. A Mirena IUD was inserted and the strings trimmed.   All the instruments were removed from the vagina.  Final instrument counts were correct.  The patient was taken to the PACU in stable condition.

## 2014-08-22 NOTE — Transfer of Care (Signed)
Immediate Anesthesia Transfer of Care Note  Patient: Alexandra Morales  Procedure(s) Performed: Procedure(s) with comments: DILATATION AND EVACUATION (N/A) - Exam under anesthesia, pap smear and Mirena insertion Patient is 461lb at 5'7" Wade /HYSTEROSCOPY  Patient Location: PACU  Anesthesia Type:General  Level of Consciousness: awake, alert  and oriented  Airway & Oxygen Therapy: Patient Spontanous Breathing and Patient connected to nasal cannula oxygen  Post-op Assessment: Report given to PACU RN, Post -op Vital signs reviewed and stable and Patient moving all extremities X 4  Post vital signs: Reviewed and stable  Complications: No apparent anesthesia complications

## 2014-08-22 NOTE — Anesthesia Postprocedure Evaluation (Signed)
  Anesthesia Post-op Note  Anesthesia Post Note  Patient: Alexandra Morales  Procedure(s) Performed: Procedure(s): DILATATION AND CURETTAGE /HYSTEROSCOPY, Exam Under Anesthesia, Pap Smear, Mirena Insertion  Anesthesia type: General  Patient location: Women's Unit  Post pain: Pain level controlled  Post assessment: Post-op Vital signs reviewed  Last Vitals:  Filed Vitals:   08/22/14 1728  BP: 133/63  Pulse: 85  Temp: 37.2 C  Resp: 20    Post vital signs: Reviewed  Level of consciousness: sedated  Complications: No apparent anesthesia complications

## 2014-08-22 NOTE — Anesthesia Postprocedure Evaluation (Signed)
Anesthesia Post Note  Patient: Alexandra Morales  Procedure(s) Performed: Procedure(s) (LRB): DILATATION AND EVACUATION (N/A) DILATATION AND CURETTAGE /HYSTEROSCOPY  Anesthesia type: GA  Patient location: PACU  Post pain: Pain level controlled  Post assessment: Post-op Vital signs reviewed  Last Vitals:  Filed Vitals:   08/22/14 1119  BP: 175/88  Pulse: 106  Temp: 37 C    Post vital signs: Reviewed  Level of consciousness: sedated  Complications: No apparent anesthesia complications-- will keep overnight in light of her elevated BMI and likely undiagnosed OSA.

## 2014-08-22 NOTE — H&P (Addendum)
Chief Complaint: 43 y.o. presents with AUB/thickened endometrium on U/S  Details of Present Illness: She carries a diagnosis of PCOS.   She has a h/o several episodes of prolonged, heavy bleeding.  She has received a blood transfusion in the past.  A recent pelvic U/S showed a thickened endometrial lining.  There were no vitals taken for this visit.  Past Medical History  Diagnosis Date  . Hypertension   . Anemia   . History of blood transfusion 04/26/2014    [facility] - 5 Units transfused  . PCOS (polycystic ovarian syndrome)     tx with metformin   History   Social History  . Marital Status: Single    Spouse Name: N/A    Number of Children: N/A  . Years of Education: N/A   Occupational History  . Not on file.   Social History Main Topics  . Smoking status: Never Smoker   . Smokeless tobacco: Never Used  . Alcohol Use: 0.0 oz/week    0 Not specified per week     Comment: occasionally   . Drug Use: No  . Sexual Activity: Not Currently    Birth Control/ Protection: Pill   Other Topics Concern  . Not on file   Social History Narrative   Lives in [place].    Co-manager at [employer]   Family History  Problem Relation Age of Onset  . Cancer Mother     Pertinent items are noted in HPI.  Pre-Op Diagnosis: AUB/ thickened endometrium/ PCOS 57410/ 58300   Planned Procedure: Procedure(s): Diagnostic D&C, hysteroscopy, Mirena IUD insertion  Transfuse 2 units PRBC perioperatively  I have reviewed the patient's history and have completed the physical exam and [name] is acceptable for surgery.  Agnes Lawrence, MD 08/22/2014 8:27 AM

## 2014-08-23 ENCOUNTER — Encounter (HOSPITAL_COMMUNITY): Payer: Self-pay | Admitting: Obstetrics & Gynecology

## 2014-08-23 ENCOUNTER — Encounter: Payer: Self-pay | Admitting: Obstetrics & Gynecology

## 2014-08-23 DIAGNOSIS — C541 Malignant neoplasm of endometrium: Secondary | ICD-10-CM | POA: Diagnosis not present

## 2014-08-23 LAB — TYPE AND SCREEN
ABO/RH(D): A POS
Antibody Screen: NEGATIVE
UNIT DIVISION: 0
Unit division: 0
Unit division: 0
Unit division: 0

## 2014-08-23 LAB — CBC
HEMATOCRIT: 26.8 % — AB (ref 36.0–46.0)
HEMOGLOBIN: 7.6 g/dL — AB (ref 12.0–15.0)
MCH: 20.8 pg — ABNORMAL LOW (ref 26.0–34.0)
MCHC: 28.4 g/dL — AB (ref 30.0–36.0)
MCV: 73.4 fL — ABNORMAL LOW (ref 78.0–100.0)
Platelets: 335 10*3/uL (ref 150–400)
RBC: 3.65 MIL/uL — ABNORMAL LOW (ref 3.87–5.11)
RDW: 19.8 % — ABNORMAL HIGH (ref 11.5–15.5)
WBC: 12 10*3/uL — ABNORMAL HIGH (ref 4.0–10.5)

## 2014-08-23 LAB — BASIC METABOLIC PANEL
ANION GAP: 4 — AB (ref 5–15)
BUN: 23 mg/dL (ref 6–23)
CHLORIDE: 108 meq/L (ref 96–112)
CO2: 23 mmol/L (ref 19–32)
CREATININE: 1.13 mg/dL — AB (ref 0.50–1.10)
Calcium: 8.2 mg/dL — ABNORMAL LOW (ref 8.4–10.5)
GFR calc Af Amer: 68 mL/min — ABNORMAL LOW (ref >90)
GFR calc non Af Amer: 59 mL/min — ABNORMAL LOW (ref >90)
Glucose, Bld: 158 mg/dL — ABNORMAL HIGH (ref 70–99)
Potassium: 5.3 mmol/L — ABNORMAL HIGH (ref 3.5–5.1)
Sodium: 135 mmol/L (ref 135–145)

## 2014-08-23 MED ORDER — OXYCODONE-ACETAMINOPHEN 5-325 MG PO TABS: 1.0000 | ORAL_TABLET | ORAL | Status: DC | PRN

## 2014-08-23 MED ORDER — IBUPROFEN 800 MG PO TABS: 800.0000 mg | ORAL_TABLET | Freq: Three times a day (TID) | ORAL | Status: DC | PRN

## 2014-08-23 NOTE — Discharge Summary (Signed)
Physician Discharge Summary  Patient ID: Alexandra Morales MRN: [id number] DOB/AGE: Jun 23, 1971 43 y.o.  Admit date: 08/22/2014 Discharge date: 08/23/2014  Admission Diagnoses:  AUB.  Thickened Endometrium on Ultrasound  Discharge Diagnoses: Same Active Problems:   Abnormal uterine bleeding   Discharged Condition: good  Hospital Course: Underwent Hysteroscopy, D&C, Pap Smear, and Mirena IUD Insertion without complications.  Kept for overnight observation because of morbid obesity.  Doing well this AM.  Consults: Anesthesia  Significant Diagnostic Studies: labs: CBC  Treatments: IV hydration and analgesia: Percocet  Discharge Exam: Blood pressure 123/58, pulse 75, temperature 98.5 F (36.9 C), temperature source Oral, resp. rate 20, height 5' 4.5" (1.638 m), weight 427 lb (193.686 kg), last menstrual period 08/01/2014, SpO2 95 %. General appearance: alert and no distress Resp: clear to auscultation bilaterally Cardio: regular rate and rhythm, S1, S2 normal, no murmur, click, rub or gallop Pelvic: vagina normal without discharge Extremities: extremities normal, atraumatic, no cyanosis or edema  Disposition: 01-Home or Self Care     Medication List    TAKE these medications        ferrous sulfate 325 (65 FE) MG tablet  Take 325 mg by mouth 3 (three) times daily with meals.     hydrochlorothiazide 12.5 MG tablet  Commonly known as:  HYDRODIURIL     ibuprofen 800 MG tablet  Commonly known as:  ADVIL,MOTRIN  Take 1 tablet (800 mg total) by mouth every 8 (eight) hours as needed.     lisinopril-hydrochlorothiazide 10-12.5 MG per tablet  Commonly known as:  PRINZIDE,ZESTORETIC  Take 1 tablet by mouth daily.     metFORMIN 500 MG tablet  Commonly known as:  GLUCOPHAGE  Take 1 tablet (500 mg total) by mouth daily with breakfast.     naproxen sodium 220 MG tablet  Commonly known as:  ANAPROX  Take 440 mg by mouth 2 (two) times daily as needed.     norethindrone 5 MG  tablet  Commonly known as:  AYGESTIN  Take 2 tablets (10 mg total) by mouth daily.     oxyCODONE-acetaminophen 5-325 MG per tablet  Commonly known as:  PERCOCET/ROXICET  Take 1-2 tablets by mouth every 4 (four) hours as needed for severe pain (moderate to severe pain (when tolerating fluids)).           Follow-up Information    Follow up with Lahoma Crocker A, MD. Schedule an appointment as soon as possible for a visit in 2 weeks.   Specialty:  Obstetrics and Gynecology   Contact information:   [address] [place] [address] [phone]       Signed: Shelly Bombard 08/23/2014, 9:26 AM

## 2014-08-23 NOTE — Progress Notes (Signed)
1 Day Post-Op Procedure(s): DILATATION AND CURETTAGE /HYSTEROSCOPY, Exam Under Anesthesia, Pap Smear, Mirena Insertion  Subjective: Patient reports tolerating PO, + flatus and no problems voiding.    Objective: I have reviewed patient's vital signs, intake and output, medications and labs.  General: alert and no distress Resp: clear to auscultation bilaterally Cardio: regular rate and rhythm, S1, S2 normal, no murmur, click, rub or gallop Extremities: extremities normal, atraumatic, no cyanosis or edema Vaginal Bleeding: none  Assessment: s/p Procedure(s): DILATATION AND CURETTAGE /HYSTEROSCOPY, Exam Under Anesthesia, Pap Smear, Mirena Insertion: stable  Anemia.  Chronic iron deficiency.  Clinically stable.  Plan: Discharge home  LOS: 1 day    Tamario Heal A 08/23/2014, 9:20 AM

## 2014-08-23 NOTE — Progress Notes (Signed)
Pt is discharged in the care of friend. Downstairs per ambulatory,withN.T escort.Denies any pain or discomfort.or heavy bleeding. Spirits are good.  No equipment needed for home use. Understands all discharge instruction.

## 2014-08-24 LAB — CYTOLOGY - PAP

## 2014-08-25 ENCOUNTER — Encounter: Payer: Self-pay | Admitting: Obstetrics & Gynecology

## 2014-08-25 ENCOUNTER — Encounter: Payer: Self-pay | Admitting: *Deleted

## 2014-08-25 ENCOUNTER — Ambulatory Visit (INDEPENDENT_AMBULATORY_CARE_PROVIDER_SITE_OTHER): Payer: BC Managed Care – PPO | Admitting: Obstetrics & Gynecology

## 2014-08-25 VITALS — BP 159/81 | HR 97 | Wt >= 6400 oz

## 2014-08-25 DIAGNOSIS — C541 Malignant neoplasm of endometrium: Secondary | ICD-10-CM

## 2014-08-25 MED ORDER — MEGESTROL ACETATE 40 MG PO TABS: 40.0000 mg | ORAL_TABLET | Freq: Two times a day (BID) | ORAL | Status: DC

## 2014-08-25 NOTE — Patient Instructions (Signed)
Uterine Cancer Uterine cancer is an abnormal growth of tissue (tumor) in the uterus that is cancerous (malignant). Unlike noncancerous (benign) tumors, malignant tumors can spread to other parts of your body. The wall of the uterus has two layers of tissue. The inner layer is the endometrium. The outer layer of muscle tissue is the myometrium. The most common type of uterine cancer begins in the endometrium. This is called endometrial cancer. Cancer that begins in the myometrium is called uterine sarcoma, which is very rare.  RISK FACTORS  Although the exact cause of uterine cancer is unknown, there are a number of risk factors that can increase your chances of getting uterine cancer. They include:  Your age. Uterine cancer occurs mostly in women older than 50 years.   Having an enlarged endometrium (endometrial hyperplasia).   Using hormone therapy.   Obesity.   Taking the drug tamoxifen.   White race.   Infertility.   Never being pregnant.   Beginning menstrual periods at an age younger than 12 years.   Having menstrual periods at an age older than 47 years.   Personal history of ovarian, intestinal, or colorectal cancer.   Having a family history of uterine cancer.   Having a family history of hereditary nonpolyposis colon cancer (HNPCC).   Having diabetes, high blood pressure, thyroid disease, or gallbladder disease.   Long-term use of high-dose birth control pills.   Exposure to radiation.   Smoking.  SIGNS AND SYMPTOMS   Abnormal vaginal bleeding or discharge. Bleeding may start as a watery, blood-streaked flow that gradually contains more blood.   Any vaginal bleeding after menopause.   Difficult or painful urination.   Pain during intercourse.   Pain in the pelvic area.  Mass in the vagina.  Pain or fullness in the abdomen.  Frequent urination.  Bleeding between periods.  Growth of the stomach.   Unexplained weight loss.   Uterine cancer usually occurs after menopause. However, it may also occur around the time that menopause begins. Abnormal vaginal bleeding is the most common symptom of uterine cancer. Women should not assume that abnormal vaginal bleeding is part of menopause. DIAGNOSIS  Your health care provider will ask about your medical history. He or she may also perform a number of procedures, such as:  A physical and pelvic exam. Your health care provider will feel your pelvis for any lumps.   Blood and urine tests.   X-rays.   Imaging tests, such as CT scans, ultrasonography, or MRIs.   A hysteroscopy to view the inside of your uterus.   A Pap test to sample cells from the cervix and upper vagina to check for abnormal cells.   Taking a tissue sample (biopsy) from the uterine lining to look for cancer cells.   A dilation and curettage (D&C). This involves stretching (dilation) the cervix and scraping (curettage) the inside lining of the uterus to get a tissue sample. The sample is examined under a microscope to look for cancer cells.  Your cancer will be staged to determine its severity and extent. Staging is a careful attempt to find out the size of the tumor, whether the cancer has spread, and if so, to what parts of the body. You may need to have more tests to determine the stage of your cancer. The test results will help determine what treatment plan is best for you. Cancer stages include:   Stage I. The cancer is only found in the uterus.  Stage II. The  cancer has spread to the cervix.  Stage III. The cancer has spread outside the uterus, but not outside the pelvis. The cancer may have spread to the lymph nodes in the pelvis.  Stage IV. The cancer has spread to other parts of the body, such as the bladder or rectum. TREATMENT  Most women with uterine cancer are treated with surgery. This includes removing the uterus, cervix, fallopian tubes, and ovaries (total hysterectomy). Your  lymph nodes near the tumor may also be removed. Some women have radiation, chemotherapy, or hormonal therapy. Other women have a combination of these therapies. HOME CARE INSTRUCTIONS   Take medicines only as directed by your health care provider.   Maintain a healthy diet.  Exercise regularly.   If you have diabetes, high blood pressure, thyroid disease, or gallbladder disease, follow your health care provider's instructions to keep it under control.   Do not smoke.   Consider joining a support group. This may help you learn to cope with the stress of having uterine cancer.   Seek advice to help you manage treatment side effects.   Keep all follow-up visits as directed by your health care provider.  SEEK MEDICAL CARE IF:  You have increased stomach or pelvic pain.  You cannot urinate.  You have abnormal bleeding. Document Released: 08/12/2005 Document Revised: 12/27/2013 Document Reviewed: 01/29/2013 [facility] Patient Information 2015 [place], [place]. This information is not intended to replace advice given to you by your health care provider. Make sure you discuss any questions you have with your health care provider.

## 2014-09-01 ENCOUNTER — Encounter: Payer: Self-pay | Admitting: Obstetrics & Gynecology

## 2014-09-01 ENCOUNTER — Ambulatory Visit: Payer: BLUE CROSS/BLUE SHIELD | Attending: Gynecology | Admitting: Gynecology

## 2014-09-01 ENCOUNTER — Encounter: Payer: Self-pay | Admitting: Gynecology

## 2014-09-01 VITALS — BP 141/81 | HR 91 | Temp 98.1°F | Resp 24 | Ht 64.5 in | Wt >= 6400 oz

## 2014-09-01 DIAGNOSIS — Z975 Presence of (intrauterine) contraceptive device: Secondary | ICD-10-CM | POA: Insufficient documentation

## 2014-09-01 DIAGNOSIS — E282 Polycystic ovarian syndrome: Secondary | ICD-10-CM | POA: Diagnosis not present

## 2014-09-01 DIAGNOSIS — Z9049 Acquired absence of other specified parts of digestive tract: Secondary | ICD-10-CM | POA: Diagnosis not present

## 2014-09-01 DIAGNOSIS — D649 Anemia, unspecified: Secondary | ICD-10-CM | POA: Insufficient documentation

## 2014-09-01 DIAGNOSIS — I10 Essential (primary) hypertension: Secondary | ICD-10-CM | POA: Diagnosis not present

## 2014-09-01 DIAGNOSIS — C541 Malignant neoplasm of endometrium: Secondary | ICD-10-CM | POA: Insufficient documentation

## 2014-09-01 DIAGNOSIS — Z79899 Other long term (current) drug therapy: Secondary | ICD-10-CM | POA: Insufficient documentation

## 2014-09-01 HISTORY — DX: Malignant neoplasm of endometrium: C54.1

## 2014-09-01 NOTE — Progress Notes (Signed)
Consult Note: Gyn-Onc   Alexandra Morales 44 y.o. female  Chief Complaint  Patient presents with  . endometrial cancer    Assessment : Grade 1 endometrial carcinoma. Severe morbid obesity.  Plan: The patient had a Mirena IUD placed at the time of her D&C. I'm in full agreement that this is the best approach in managing this patient. I discussed with the patient and her friend the mechanism by which the Mirena IUD can treat endometrial cancer. She is satisfied with this plan. We will have her return to see Dr. Delsa Sale in 3-4 months and have a repeat ultrasound to assess the endometrial stripe as well as obtain an endometrial biopsy.  I would recommend the patient have a mammogram.  We also discussed the possibility of bariatric surgery.  HPI: 44 year old white female who is seen in consultation at the request of Dr. Delsa Sale regarding management of a newly diagnosed grade 1 endometrial carcinoma. The patient presented with abnormal uterine bleeding and underwent a D&C and insertion of a Mirena IUD on 08/22/2014. Postoperatively she has done well.  The patient's primary problem is that she is morbidly obese weighing 471 pounds. She denies any GI or GU symptoms has no pelvic pain pressure and is only having some vaginal spotting.  She has never had a mammogram.  Review of Systems:10 point review of systems is negative except as noted in interval history.   Vitals: Blood pressure 141/81, pulse 91, temperature 98.1 F (36.7 C), temperature source Oral, resp. rate 24, height 5' 4.5" (1.638 m), weight 471 lb (213.644 kg), last menstrual period 08/09/2014.  Physical Exam: General : The patient is a healthy woman in no acute distress.  HEENT: normocephalic, extraoccular movements normal; neck is supple without thyromegally  Lynphnodes: Supraclavicular and inguinal nodes not enlarged  Abdomen: Morbidly obese Soft, non-tender, no ascites, no organomegally, no masses, no hernias   Pelvic:  EGBUS: Normal female  Vagina: Normal, no lesions  Urethra and Bladder: Normal, non-tender  Cervix: Normal with IUD string in place Uterus: Unable to evaluate secondary to body habitus. Bi-manual examination: Non-tender; no adenxal masses or nodularity  Rectal: normal sphincter tone, no masses, no blood  Lower extremities: No edema or varicosities. Normal range of motion      No Known Allergies  Past Medical History  Diagnosis Date  . Hypertension   . Anemia   . History of blood transfusion 04/26/2014    [facility] - 5 Units transfused  . PCOS (polycystic ovarian syndrome)     tx with metformin    Past Surgical History  Procedure Laterality Date  . Cholecystectomy    . Corneal transplant Right   . Wisdom tooth extraction    . Hysteroscopy w/d&c  08/22/2014    Procedure: DILATATION AND CURETTAGE /HYSTEROSCOPY, Exam Under Anesthesia, Pap Smear, Mirena Insertion;  Surgeon: Lahoma Crocker, MD;  Location: [facility];  Service: Gynecology;;    Current Outpatient Prescriptions  Medication Sig Dispense Refill  . ferrous sulfate 325 (65 FE) MG tablet Take 325 mg by mouth 3 (three) times daily with meals.    Marland Kitchen ibuprofen (ADVIL,MOTRIN) 800 MG tablet Take 1 tablet (800 mg total) by mouth every 8 (eight) hours as needed. 30 tablet 5  . lisinopril-hydrochlorothiazide (PRINZIDE,ZESTORETIC) 10-12.5 MG per tablet Take 1 tablet by mouth daily. 90 tablet 3  . megestrol (MEGACE) 40 MG tablet Take 1 tablet (40 mg total) by mouth 2 (two) times daily. 180 tablet 1  . metFORMIN (GLUCOPHAGE) 500 MG  tablet Take 1 tablet (500 mg total) by mouth daily with breakfast. 30 tablet 2  . oxyCODONE-acetaminophen (PERCOCET/ROXICET) 5-325 MG per tablet Take 1-2 tablets by mouth every 4 (four) hours as needed for severe pain (moderate to severe pain (when tolerating fluids)). 40 tablet 0   No current facility-administered medications for this visit.    History   Social History  . Marital Status: Single     Spouse Name: N/A    Number of Children: N/A  . Years of Education: N/A   Occupational History  . Not on file.   Social History Main Topics  . Smoking status: Never Smoker   . Smokeless tobacco: Never Used  . Alcohol Use: 0.0 oz/week    0 Not specified per week     Comment: occasionally   . Drug Use: No  . Sexual Activity: Not Currently    Birth Control/ Protection: Pill   Other Topics Concern  . Not on file   Social History Narrative   Lives in [place].    Co-manager at [employer]    Family History  Problem Relation Age of Onset  . Cancer Mother       Alvino Chapel, MD 09/01/2014, 10:25 AM

## 2014-09-01 NOTE — Progress Notes (Signed)
Subjective:     Alexandra Morales is a 44 y.o. female who presents to the clinic several days status post D&C/hysteroscopy/Mirena IUD insertion for abnormal uterine bleeding and thickened endometrium on U/S.  The patient is not having any pain.  The following portions of the patient's history were reviewed and updated as appropriate: allergies, current medications, past family history, past medical history, past social history, past surgical history and problem list.  Review of Systems Pertinent items are noted in HPI.    Objective:    BP 159/81 mmHg  Pulse 97  Wt 213.644 kg (471 lb)  LMP 08/09/2014 (Exact Date)      Assessment:  Recently diagnosed likely FIGO grade I endometrial adenocarcinoma  Doing well postoperatively. Operative findings again reviewed. Pathology report discussed.    Plan:   Orders Placed This Encounter  Procedures  . Ambulatory referral to Gynecologic Oncology    Referral Priority:  Routine    Referral Type:  Consultation    Referral Reason:  Specialty Services Required    Requested Specialty:  Oncology    Number of Visits Requested:  1   Meds ordered this encounter  Medications  . megestrol (MEGACE) 40 MG tablet    Sig: Take 1 tablet (40 mg total) by mouth 2 (two) times daily.    Dispense:  180 tablet    Refill:  1    Continue any current medications.  Activity restrictions: pelvic rest.  Anticipated return to work: now. Follow up: prn

## 2014-09-01 NOTE — Patient Instructions (Signed)
Followup in 3 months with Dr. Delsa Sale for ultrasound and biopsy. We are happy to followup in the future if needed.

## 2014-09-06 ENCOUNTER — Ambulatory Visit (INDEPENDENT_AMBULATORY_CARE_PROVIDER_SITE_OTHER): Payer: BLUE CROSS/BLUE SHIELD | Admitting: Obstetrics

## 2014-09-06 ENCOUNTER — Encounter: Payer: Self-pay | Admitting: Obstetrics

## 2014-09-06 VITALS — BP 139/82 | HR 92 | Temp 99.1°F | Ht 65.0 in

## 2014-09-06 DIAGNOSIS — Z1239 Encounter for other screening for malignant neoplasm of breast: Secondary | ICD-10-CM

## 2014-09-06 DIAGNOSIS — C541 Malignant neoplasm of endometrium: Secondary | ICD-10-CM

## 2014-09-07 ENCOUNTER — Encounter: Payer: Self-pay | Admitting: Obstetrics

## 2014-09-07 NOTE — Progress Notes (Signed)
Assessment    AUB Endometrial CA     Plan    Patient will F/U with Gyn Oncology for further management.   Orders Placed This Encounter  Procedures  . MM Digital Screening    Standing Status: Future     Number of Occurrences:      Standing Expiration Date: 11/05/2015    Order Specific Question:  Reason for Exam (SYMPTOM  OR DIAGNOSIS REQUIRED)    Answer:  screening    Order Specific Question:  Is the patient pregnant?    Answer:  No    Order Specific Question:  Preferred imaging location?    Answer:  [facility]   No orders of the defined types were placed in this encounter.      Patient ID: Alexandra Morales, female   DOB: [birth year], 44 y.o.   MRN: [id number]  Chief Complaint  Patient presents with  . Routine Post Op    D/C     HPI Alexandra Morales is a 44 y.o. female.  S/P D&C, Exam under Anesthesia and Mirena IUD Placement.  HPI  Past Medical History  Diagnosis Date  . Hypertension   . Anemia   . History of blood transfusion 04/26/2014    [facility] - 5 Units transfused  . PCOS (polycystic ovarian syndrome)     tx with metformin  . Cancer     Past Surgical History  Procedure Laterality Date  . Cholecystectomy    . Corneal transplant Right   . Wisdom tooth extraction    . Hysteroscopy w/d&c  08/22/2014    Procedure: DILATATION AND CURETTAGE /HYSTEROSCOPY, Exam Under Anesthesia, Pap Smear, Mirena Insertion;  Surgeon: Lahoma Crocker, MD;  Location: [facility];  Service: Gynecology;;    Family History  Problem Relation Age of Onset  . Cancer Mother     Social History History  Substance Use Topics  . Smoking status: Never Smoker   . Smokeless tobacco: Never Used  . Alcohol Use: 0.0 oz/week    0 Not specified per week     Comment: occasionally     No Known Allergies  Current Outpatient Prescriptions  Medication Sig Dispense Refill  . ferrous sulfate 325 (65 FE) MG tablet Take 325 mg by mouth 3 (three) times daily with meals.    Marland Kitchen ibuprofen  (ADVIL,MOTRIN) 800 MG tablet Take 1 tablet (800 mg total) by mouth every 8 (eight) hours as needed. 30 tablet 5  . lisinopril-hydrochlorothiazide (PRINZIDE,ZESTORETIC) 10-12.5 MG per tablet Take 1 tablet by mouth daily. 90 tablet 3  . megestrol (MEGACE) 40 MG tablet Take 1 tablet (40 mg total) by mouth 2 (two) times daily. 180 tablet 1  . oxyCODONE-acetaminophen (PERCOCET/ROXICET) 5-325 MG per tablet Take 1-2 tablets by mouth every 4 (four) hours as needed for severe pain (moderate to severe pain (when tolerating fluids)). 40 tablet 0  . metFORMIN (GLUCOPHAGE) 500 MG tablet Take 1 tablet (500 mg total) by mouth daily with breakfast. (Patient not taking: Reported on 09/06/2014) 30 tablet 2   No current facility-administered medications for this visit.    Review of Systems Review of Systems Constitutional: negative for fatigue and weight loss Respiratory: negative for cough and wheezing Cardiovascular: negative for chest pain, fatigue and palpitations Gastrointestinal: negative for abdominal pain and change in bowel habits Genitourinary: AUB Integument/breast: negative for nipple discharge Musculoskeletal:negative for myalgias Neurological: negative for gait problems and tremors Behavioral/Psych: negative for abusive relationship, depression Endocrine: negative for temperature intolerance     Blood pressure  139/82, pulse 92, temperature 99.1 F (37.3 C), height 5\' 5"  (1.651 m), last menstrual period 08/09/2014.  Physical Exam Physical Exam:  Deferred  100% of 10 min visit spent on counseling and coordination of care.   Data Reviewed Pathology

## 2014-09-08 ENCOUNTER — Other Ambulatory Visit: Payer: Self-pay | Admitting: *Deleted

## 2014-09-08 ENCOUNTER — Telehealth: Payer: Self-pay

## 2014-09-08 DIAGNOSIS — E282 Polycystic ovarian syndrome: Secondary | ICD-10-CM

## 2014-09-08 MED ORDER — METFORMIN HCL 500 MG PO TABS: 500.0000 mg | ORAL_TABLET | Freq: Every day | ORAL | Status: DC

## 2014-09-08 NOTE — Telephone Encounter (Signed)
faxed patient's medical records to [facility] [phone], also called them at [phone] and they said they would call patient to sch with Dr, Delsa Sale

## 2014-09-09 ENCOUNTER — Ambulatory Visit (HOSPITAL_COMMUNITY)
Admission: RE | Admit: 2014-09-09 | Discharge: 2014-09-09 | Disposition: A | Discharge status: Home / Self Care | Payer: BLUE CROSS/BLUE SHIELD | Source: Ambulatory Visit | Attending: Obstetrics | Admitting: Obstetrics

## 2014-09-09 DIAGNOSIS — Z1239 Encounter for other screening for malignant neoplasm of breast: Secondary | ICD-10-CM

## 2014-09-09 DIAGNOSIS — Z1231 Encounter for screening mammogram for malignant neoplasm of breast: Secondary | ICD-10-CM | POA: Insufficient documentation

## 2017-02-18 ENCOUNTER — Encounter: Payer: Self-pay | Admitting: Genetic Counselor

## 2018-02-05 ENCOUNTER — Ambulatory Visit (INDEPENDENT_AMBULATORY_CARE_PROVIDER_SITE_OTHER): Payer: BLUE CROSS/BLUE SHIELD | Admitting: Obstetrics and Gynecology

## 2018-02-05 ENCOUNTER — Encounter: Payer: Self-pay | Admitting: Obstetrics and Gynecology

## 2018-02-05 VITALS — BP 170/77 | HR 106 | Ht 67.0 in | Wt >= 6400 oz

## 2018-02-05 DIAGNOSIS — C541 Malignant neoplasm of endometrium: Secondary | ICD-10-CM

## 2018-02-05 DIAGNOSIS — N939 Abnormal uterine and vaginal bleeding, unspecified: Secondary | ICD-10-CM

## 2018-02-05 DIAGNOSIS — I10 Essential (primary) hypertension: Secondary | ICD-10-CM

## 2018-02-05 DIAGNOSIS — Z1231 Encounter for screening mammogram for malignant neoplasm of breast: Secondary | ICD-10-CM

## 2018-02-05 DIAGNOSIS — Z1239 Encounter for other screening for malignant neoplasm of breast: Secondary | ICD-10-CM

## 2018-02-05 MED ORDER — MEGESTROL ACETATE 40 MG PO TABS
40.0000 mg | ORAL_TABLET | Freq: Two times a day (BID) | ORAL | 5 refills | Status: DC
Start: 2018-02-05 — End: 2018-04-29

## 2018-02-05 NOTE — Progress Notes (Signed)
Pt c/o menorrhagia and dysmenorrhea since November 2018. She is changing 5-6 pads every hour.

## 2018-02-05 NOTE — Progress Notes (Signed)
GYNECOLOGY OFFICE VISIT NOTE  History:  47 y.o. G0P0000 here today for follow up for vaginal bleeding after diagnosis of Figo 1 endometrial adenocarcinoma.   S/p D&C hysteroscopy, IUD placement. Bleeding stopped for 9 months, then started again and has been irregular. Reports heavy, irregular bleeding, states she is having clots. She is having to keep clothes at work to change due to heavy bleeding. Also with significant cramping and intense pressure in "private area." Does have some shortness of breath and fatigue.  Patient was to f/u in 4 months for [place] and EMB but was lost to follow up.  Past Medical History:  Diagnosis Date  . Anemia   . Cancer ([place])   . Endometrial cancer ([place]) 09/01/2014  . History of blood transfusion 04/26/2014   [facility] - 5 Units transfused  . Hypertension   . PCOS (polycystic ovarian syndrome)    tx with metformin    Past Surgical History:  Procedure Laterality Date  . CHOLECYSTECTOMY    . CORNEAL TRANSPLANT Right   . HYSTEROSCOPY W/D&C  08/22/2014   Procedure: DILATATION AND CURETTAGE /HYSTEROSCOPY, Exam Under Anesthesia, Pap Smear, Mirena Insertion;  Surgeon: Lahoma Crocker, MD;  Location: [facility];  Service: Gynecology;;  . Arnetha Courser TOOTH EXTRACTION      Current Outpatient Medications:  .  ferrous sulfate 325 (65 FE) MG tablet, Take 325 mg by mouth 3 (three) times daily with meals., Disp: , Rfl:  .  ibuprofen (ADVIL,MOTRIN) 800 MG tablet, Take 1 tablet (800 mg total) by mouth every 8 (eight) hours as needed. (Patient not taking: Reported on 02/05/2018), Disp: 30 tablet, Rfl: 5 .  lisinopril-hydrochlorothiazide (PRINZIDE,ZESTORETIC) 10-12.5 MG per tablet, Take 1 tablet by mouth daily. (Patient not taking: Reported on 02/05/2018), Disp: 90 tablet, Rfl: 3 .  megestrol (MEGACE) 40 MG tablet, Take 1 tablet (40 mg total) by mouth 2 (two) times daily. (Patient not taking: Reported on 02/05/2018), Disp: 180 tablet, Rfl: 1 .  megestrol (MEGACE) 40 MG tablet, Take 1  tablet (40 mg total) by mouth 2 (two) times daily. Can increase to two tablets twice a day in the event of heavy bleeding, Disp: 60 tablet, Rfl: 5 .  metFORMIN (GLUCOPHAGE) 500 MG tablet, Take 1 tablet (500 mg total) by mouth daily with breakfast. (Patient not taking: Reported on 02/05/2018), Disp: 30 tablet, Rfl: 2 .  oxyCODONE-acetaminophen (PERCOCET/ROXICET) 5-325 MG per tablet, Take 1-2 tablets by mouth every 4 (four) hours as needed for severe pain (moderate to severe pain (when tolerating fluids)). (Patient not taking: Reported on 02/05/2018), Disp: 40 tablet, Rfl: 0  The following portions of the patient's history were reviewed and updated as appropriate: allergies, current medications, past family history, past medical history, past social history, past surgical history and problem list.   Review of Systems:  Pertinent items noted in HPI and remainder of comprehensive ROS otherwise negative.   Objective:  Physical Exam BP (!) 170/77   Pulse (!) 106   Ht 5\' 7"  (1.702 m)   Wt (!) 463 lb 4.8 oz (210.2 kg)   LMP  (LMP Unknown)   BMI 72.56 kg/m  CONSTITUTIONAL: Well-developed, well-nourished female in no acute distress. Morbidly obese, appears fatigued NEUROLOGIC: Alert and oriented to person, place, and time. Normal reflexes, muscle tone coordination. No cranial nerve deficit noted. PSYCHIATRIC: Normal mood and affect. Normal behavior. Normal judgment and thought content.  Labs and Imaging No results found.  Assessment & Plan:  1. Abnormal uterine bleeding (AUB) IUD remains in place -  CBC - Comprehensive metabolic panel - TSH - T4, free  2. Endometrial adenocarcinoma ([place]) Will have patient follow up with GYN Oncology for further management - megestrol (MEGACE) 40 MG tablet; Take 1 tablet (40 mg total) by mouth 2 (two) times daily. Can increase to two tablets twice a day in the event of heavy bleeding  Dispense: 60 tablet; Refill: 5 - US PELVIC COMPLETE WITH TRANSVAGINAL;  Future - Ambulatory referral to Gynecologic Oncology  3. Screening for breast cancer - MM 3D SCREEN BREAST BILATERAL; Future  4. Hypertension, unspecified type - Ambulatory referral to Internal Medicine   Routine preventative health maintenance measures emphasized. Please refer to After Visit Summary for other counseling recommendations.   Return if symptoms worsen or fail to improve.    Feliz Beam, M.D. Attending [place] for [employer], [place]

## 2018-02-06 ENCOUNTER — Encounter: Payer: Self-pay | Admitting: Obstetrics and Gynecology

## 2018-02-06 ENCOUNTER — Encounter (HOSPITAL_COMMUNITY): Payer: Self-pay | Admitting: Emergency Medicine

## 2018-02-06 ENCOUNTER — Observation Stay (HOSPITAL_COMMUNITY)
Admission: EM | Admit: 2018-02-06 | Discharge: 2018-02-08 | Disposition: A | Discharge status: Home / Self Care | Payer: BLUE CROSS/BLUE SHIELD | Attending: Family Medicine | Admitting: Family Medicine

## 2018-02-06 ENCOUNTER — Telehealth: Payer: Self-pay

## 2018-02-06 ENCOUNTER — Encounter: Payer: Self-pay | Admitting: Obstetrics & Gynecology

## 2018-02-06 ENCOUNTER — Telehealth: Payer: Self-pay | Admitting: Obstetrics & Gynecology

## 2018-02-06 ENCOUNTER — Telehealth: Payer: Self-pay | Admitting: Obstetrics and Gynecology

## 2018-02-06 ENCOUNTER — Other Ambulatory Visit: Payer: Self-pay

## 2018-02-06 DIAGNOSIS — D500 Iron deficiency anemia secondary to blood loss (chronic): Secondary | ICD-10-CM | POA: Diagnosis not present

## 2018-02-06 DIAGNOSIS — N939 Abnormal uterine and vaginal bleeding, unspecified: Secondary | ICD-10-CM

## 2018-02-06 DIAGNOSIS — R2242 Localized swelling, mass and lump, left lower limb: Secondary | ICD-10-CM

## 2018-02-06 DIAGNOSIS — C541 Malignant neoplasm of endometrium: Secondary | ICD-10-CM

## 2018-02-06 DIAGNOSIS — E876 Hypokalemia: Secondary | ICD-10-CM | POA: Insufficient documentation

## 2018-02-06 DIAGNOSIS — Z947 Corneal transplant status: Secondary | ICD-10-CM | POA: Diagnosis not present

## 2018-02-06 DIAGNOSIS — D649 Anemia, unspecified: Secondary | ICD-10-CM

## 2018-02-06 DIAGNOSIS — Z79899 Other long term (current) drug therapy: Secondary | ICD-10-CM | POA: Diagnosis not present

## 2018-02-06 DIAGNOSIS — S81801A Unspecified open wound, right lower leg, initial encounter: Secondary | ICD-10-CM | POA: Diagnosis not present

## 2018-02-06 DIAGNOSIS — Z791 Long term (current) use of non-steroidal anti-inflammatories (NSAID): Secondary | ICD-10-CM | POA: Insufficient documentation

## 2018-02-06 DIAGNOSIS — N938 Other specified abnormal uterine and vaginal bleeding: Secondary | ICD-10-CM

## 2018-02-06 DIAGNOSIS — E282 Polycystic ovarian syndrome: Secondary | ICD-10-CM | POA: Diagnosis not present

## 2018-02-06 DIAGNOSIS — I872 Venous insufficiency (chronic) (peripheral): Secondary | ICD-10-CM | POA: Insufficient documentation

## 2018-02-06 DIAGNOSIS — D630 Anemia in neoplastic disease: Secondary | ICD-10-CM

## 2018-02-06 DIAGNOSIS — I10 Essential (primary) hypertension: Secondary | ICD-10-CM | POA: Insufficient documentation

## 2018-02-06 DIAGNOSIS — Z6841 Body Mass Index (BMI) 40.0 and over, adult: Secondary | ICD-10-CM | POA: Insufficient documentation

## 2018-02-06 DIAGNOSIS — X58XXXA Exposure to other specified factors, initial encounter: Secondary | ICD-10-CM | POA: Diagnosis not present

## 2018-02-06 DIAGNOSIS — D4989 Neoplasm of unspecified behavior of other specified sites: Secondary | ICD-10-CM | POA: Insufficient documentation

## 2018-02-06 LAB — COMPREHENSIVE METABOLIC PANEL
A/G RATIO: 1.6 (ref 1.2–2.2)
ALT: 7 IU/L (ref 0–32)
ALT: 10 U/L — AB (ref 14–54)
AST: 10 IU/L (ref 0–40)
AST: 9 U/L — ABNORMAL LOW (ref 15–41)
Albumin: 3.9 g/dL (ref 3.5–5.5)
Albumin: 3.3 g/dL — ABNORMAL LOW (ref 3.5–5.0)
Alkaline Phosphatase: 49 IU/L (ref 39–117)
Alkaline Phosphatase: 45 U/L (ref 38–126)
Anion gap: 10 (ref 5–15)
BILIRUBIN TOTAL: 0.3 mg/dL (ref 0.0–1.2)
BUN/Creatinine Ratio: 11 (ref 9–23)
BUN: 13 mg/dL (ref 6–24)
BUN: 12 mg/dL (ref 6–20)
CALCIUM: 8.9 mg/dL (ref 8.7–10.2)
CHLORIDE: 107 mmol/L — AB (ref 96–106)
CHLORIDE: 109 mmol/L (ref 101–111)
CO2: 23 mmol/L (ref 20–29)
CO2: 24 mmol/L (ref 22–32)
Calcium: 9.1 mg/dL (ref 8.9–10.3)
Creatinine, Ser: 1.21 mg/dL — ABNORMAL HIGH (ref 0.57–1.00)
Creatinine, Ser: 1.23 mg/dL — ABNORMAL HIGH (ref 0.44–1.00)
GFR calc Af Amer: 62 mL/min/{1.73_m2} (ref >59)
GFR calc Af Amer: >60 mL/min (ref >60)
GFR, EST NON AFRICAN AMERICAN: 54 mL/min/{1.73_m2} — AB (ref >59)
GFR, EST NON AFRICAN AMERICAN: 52 mL/min — AB (ref >60)
GLOBULIN, TOTAL: 2.5 g/dL (ref 1.5–4.5)
Glucose, Bld: 104 mg/dL — ABNORMAL HIGH (ref 65–99)
Glucose: 98 mg/dL (ref 65–99)
Potassium: 4 mmol/L (ref 3.5–5.2)
Potassium: 3.8 mmol/L (ref 3.5–5.1)
SODIUM: 142 mmol/L (ref 134–144)
SODIUM: 143 mmol/L (ref 135–145)
Total Bilirubin: 0.4 mg/dL (ref 0.3–1.2)
Total Protein: 6.4 g/dL (ref 6.0–8.5)
Total Protein: 6.6 g/dL (ref 6.5–8.1)

## 2018-02-06 LAB — CBC
HCT: 19 % — ABNORMAL LOW (ref 36.0–46.0)
HEMATOCRIT: 16.6 % — AB (ref 34.0–46.6)
HEMOGLOBIN: 4.4 g/dL — AB (ref 12.0–15.0)
Hemoglobin: 4.4 g/dL — CL (ref 11.1–15.9)
MCH: 17.7 pg — ABNORMAL LOW (ref 26.6–33.0)
MCH: 17 pg — AB (ref 26.0–34.0)
MCHC: 26.5 g/dL — ABNORMAL LOW (ref 31.5–35.7)
MCHC: 23.2 g/dL — ABNORMAL LOW (ref 30.0–36.0)
MCV: 67 fL — AB (ref 79–97)
MCV: 73.4 fL — AB (ref 78.0–100.0)
PLATELETS: 270 10*3/uL (ref 150–450)
Platelets: 281 10*3/uL (ref 150–400)
RBC: 2.48 x10E6/uL — CL (ref 3.77–5.28)
RBC: 2.59 MIL/uL — ABNORMAL LOW (ref 3.87–5.11)
RDW: 22.7 % — ABNORMAL HIGH (ref 12.3–15.4)
RDW: 23.1 % — ABNORMAL HIGH (ref 11.5–15.5)
WBC: 8 10*3/uL (ref 3.4–10.8)
WBC: 9.1 10*3/uL (ref 4.0–10.5)

## 2018-02-06 LAB — TSH: TSH: 2.73 u[IU]/mL (ref 0.450–4.500)

## 2018-02-06 LAB — PREPARE RBC (CROSSMATCH)

## 2018-02-06 LAB — T4, FREE: FREE T4: 1.07 ng/dL (ref 0.82–1.77)

## 2018-02-06 MED ORDER — LISINOPRIL-HYDROCHLOROTHIAZIDE 10-12.5 MG PO TABS: 1.0000 | ORAL_TABLET | Freq: Every day | ORAL | Status: DC

## 2018-02-06 MED ORDER — SODIUM CHLORIDE 0.9 % IV SOLN
Freq: Once | INTRAVENOUS | Status: AC
  Administered 2018-02-06: 23:00:00 via INTRAVENOUS

## 2018-02-06 MED ORDER — HYDROCHLOROTHIAZIDE 12.5 MG PO CAPS
12.5000 mg | ORAL_CAPSULE | Freq: Every day | ORAL | Status: DC
  Administered 2018-02-07 – 2018-02-08 (×2): 12.5 mg via ORAL
  Filled 2018-02-06 (×2): qty 1

## 2018-02-06 MED ORDER — ACETAMINOPHEN 325 MG PO TABS: 650.0000 mg | ORAL_TABLET | Freq: Four times a day (QID) | ORAL | Status: DC | PRN

## 2018-02-06 MED ORDER — MEGESTROL ACETATE 40 MG PO TABS
40.0000 mg | ORAL_TABLET | Freq: Every day | ORAL | Status: DC
  Administered 2018-02-07 – 2018-02-08 (×2): 40 mg via ORAL
  Filled 2018-02-06 (×2): qty 1

## 2018-02-06 MED ORDER — HYDROCHLOROTHIAZIDE 12.5 MG PO CAPS: 12.5000 mg | ORAL_CAPSULE | Freq: Every day | ORAL | Status: DC

## 2018-02-06 MED ORDER — LISINOPRIL 10 MG PO TABS
10.0000 mg | ORAL_TABLET | Freq: Every day | ORAL | Status: DC
  Administered 2018-02-07 – 2018-02-08 (×2): 10 mg via ORAL
  Filled 2018-02-06 (×2): qty 1

## 2018-02-06 MED ORDER — ACETAMINOPHEN 650 MG RE SUPP: 650.0000 mg | Freq: Four times a day (QID) | RECTAL | Status: DC | PRN

## 2018-02-06 MED ORDER — LISINOPRIL 10 MG PO TABS: 10.0000 mg | ORAL_TABLET | Freq: Every day | ORAL | Status: DC

## 2018-02-06 MED ORDER — CEPHALEXIN 500 MG PO CAPS
500.0000 mg | ORAL_CAPSULE | Freq: Three times a day (TID) | ORAL | Status: DC
  Administered 2018-02-06 – 2018-02-08 (×6): 500 mg via ORAL
  Filled 2018-02-06 (×5): qty 2
  Filled 2018-02-06: qty 1
  Filled 2018-02-06: qty 2
  Filled 2018-02-06: qty 1

## 2018-02-06 NOTE — Telephone Encounter (Signed)
Faculty Practice OB/GYN Physician Phone Call Documentation  I received a call from Alexandra Morales at 0445 this morning reporting critical lab values for  Alexandra Morales, a patient with known endometrial cancer with Mirena IUD in place, AUB and history of symptomatic anemia in past needing transfusion.  Hemoglobin checked yesterday resulted as 4.2. On review of chart, patient's baseline hemoglobin is around 7; a previous admission for transfusion in 04/2014 was also for a hemoglobin of 4.3, followed by a similar admission for tranfusion in 07/2014 for a hemoglobin of 6.9.  Will send message to the [facility] office to contact patient ASAP this morning and advise her to come in for blood transfusion. She has already been scheduled for repeat a pelvic ultrasound; but this can be done while she is inpatient receiving her transfusion; and an outpatient appointment can be made with GYN ONC (inpatient consultation preferred while she is still here).  Will also notify oncoming covering team of this patient and the need for admission for transfusion and further evaluation/management.    Alexandra Schneiders, MD, [place] for [employer], [place]

## 2018-02-06 NOTE — Telephone Encounter (Signed)
Called patient and left message on vm to return call to office. Also called patients roommate who is on DPR as a contact. Left message on roommate phone to have patient return call

## 2018-02-06 NOTE — Telephone Encounter (Signed)
Called patient again to see if she was getting to the hospital and to expect a blood transfusion, not iron transfusion, when she gets there. LVM for her to call back with any questions.

## 2018-02-06 NOTE — ED Provider Notes (Signed)
I saw and evaluated the patient, reviewed the resident's note and I agree with the findings and plan.  EKG: None 47 year old female presents with dysfunctional uterine bleeding and low hemoglobin of 4.4.  On physical exam, patient is pale.  Blood pressure stable.  Case was discussed with GYN who recommends outpatient work-up for the vaginal bleeding as well as blood transfusion.  Will admit to the hospitalist service here.   Lacretia Leigh, MD 02/06/18 [phone]

## 2018-02-06 NOTE — H&P (Addendum)
[facility] Admission History and Physical Service Pager: [phone]  Patient name: Alexandra Morales record number: [id number] Date of birth: [birth year] Age: 47 y.o. Gender: female  Primary Care Provider: Bufford Lope, DO Consultants: none Code Status: Full code (obtained on admission)  Chief Complaint: Low hemoglobin  Assessment and Plan: Alexandra Morales is a 47 y.o. female presenting with symptomatic anemia. PMH is significant for dysfunctional uterine bleeding, endometrial adenocarcinoma status post D/C in 2015, hypertension (not on medication), venous insufficiency and morbid obesity  Symptomatic anemia: Hemoglobin 4.4.  MCV 73.4.  Suspicious for acute on chronic blood loss anemia due to dysfunctional uterine bleeding.  Currently no bleeding for the last 3 days.  Appears to have good bone marrow response with RDW of 23.1.  Hemodynamically stable -Admit for observation.  MedSurg.  Attending Dr. Nori Riis -Anemia panel -PT/INR/APTT -Transfuse 2 units -Posttransfusion H&H  Dysfunctional uterine bleeding: chronic issue.  Currently no bleeding for the last 3 days.  Seen by GYN yesterday, started on Megace and referred to GYN/Onc. -Continue Megace -Transvaginal ultrasound  Endometrial adenocarcinoma: Status post D and C in 2015.  Pathology with FIGO grade I endometrial adenocarcinoma, endometrioid type.  -Transvaginal ultrasound -Outpatient follow-up  Hypertension: Chronic issue.  BP slightly elevated in ED.  Has not been on medication for the last 4 years.  Takes daily ibuprofen. -Start low-dose HCTZ -May add ACE inhibitors if renal function stable -Discourage NSAIDs  ?CKD-3: serum creatinine 1.23.  1.27 about 3 years ago.  Reports taking ibuprofen daily.  Not on medication for hypertension. -Repeat BMP in a.m.  Venous insufficiency: Chronic issue.  Bilateral edema -Encourage leg elevation -Compression stocking  Right leg wound: Circular traumatic  right leg wound about 1 inch in diameter with surrounding skin erythema concerning for cellulitis.  No increased warmth to touch.  No fluctuance.  -Keflex 500 mg 3 times daily -Manage venous insufficiency as above  Left thigh mass: huge mass over her left thigh likely fatty tissue.  Slowly growing per patient. -May need MRI  FEN/GI: -Regular diet  Prophylaxis: -SCD  Disposition: Observe in MedSurg for symptomatic anemia and transfusion.  History of Present Illness:  Alexandra Morales is a 47 y.o. female presenting with low hemoglobin   Patient presented to ED after she got a call from a [place] about her low hemoglobin to 4.4.  Patient reports history of dysfunctional uterine bleeding for long time.  She had a D&C in 2015 with subsequent resolution of her dysfunctional uterine bleeding for 9 months.  Endometrial biopsy taken at the same time showed FIGO grade I endometrial adenocarcinoma, endometrioid type.  She had a return of her dysfunctional uterine bleeding that has gotten worse over the last 7 to 8 months.  She reports heavy bleeding every 2 weeks lasting 3 to 4 days.  Reports passing big clots as well.  Last bleeding episode was about 3 days ago.  She reports dyspnea, lightheadedness and fatigue.  Denies chest pain. She presented to GYN yesterday, and was started on Megace and referred to Gyn/onc.   Never smoked cigarettes.  Admits occasional alcohol.  Denies drug use. In ED, hemoglobin 4.4.  Serum creatinine 1.23.  Otherwise CMP unremarkable.  Review of Systems  Constitutional: Positive for malaise/fatigue. Negative for fever and weight loss.  HENT: Negative for sore throat.   Eyes: Negative for blurred vision.  Respiratory: Positive for shortness of breath. Negative for cough.   Cardiovascular: Positive for palpitations and leg swelling. Negative  for chest pain, orthopnea and PND.  Gastrointestinal: Negative for abdominal pain, blood in stool, diarrhea, melena, nausea and vomiting.    Genitourinary: Negative for dysuria.  Musculoskeletal: Negative for myalgias.  Skin: Negative for rash.  Neurological: Negative for speech change, focal weakness, weakness and headaches.  Endo/Heme/Allergies: Does not bruise/bleed easily.  Psychiatric/Behavioral: Negative for depression and substance abuse. The patient is not nervous/anxious.     Patient Active Problem List   Diagnosis Date Noted  . Endometrial cancer ([place]) 09/01/2014  . Abnormal uterine bleeding 08/22/2014  . Severe obesity (BMI >= 40) ([place]) 05/26/2014  . Menorrhagia with irregular cycle 05/26/2014  . Hypertension 05/03/2014  . Edema 05/03/2014  . PCOS (polycystic ovarian syndrome) 04/28/2014  . Symptomatic anemia 04/26/2014  . Anemia in neoplastic disease 04/26/2014    Past Medical History: Past Medical History:  Diagnosis Date  . Anemia   . Cancer ([place])   . Endometrial cancer ([place]) 09/01/2014  . History of blood transfusion 04/26/2014   [facility] - 5 Units transfused  . Hypertension   . PCOS (polycystic ovarian syndrome)    tx with metformin    Past Surgical History: Past Surgical History:  Procedure Laterality Date  . CHOLECYSTECTOMY    . CORNEAL TRANSPLANT Right   . HYSTEROSCOPY W/D&C  08/22/2014   Procedure: DILATATION AND CURETTAGE /HYSTEROSCOPY, Exam Under Anesthesia, Pap Smear, Mirena Insertion;  Surgeon: Lahoma Crocker, MD;  Location: [facility];  Service: Gynecology;;  . Arnetha Courser TOOTH EXTRACTION      Social History: Social History   Tobacco Use  . Smoking status: Never Smoker  . Smokeless tobacco: Never Used  Substance Use Topics  . Alcohol use: Yes    Alcohol/week: 0.0 oz    Comment: occasionally   . 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 Please also refer to relevant sections of EMR.  Family History: Family History  Problem Relation Age of Onset  . Cancer Mother     Allergies and Medications: Allergies  Allergen Reactions  . Tape Rash     Paper tape is tolerated best   No current facility-administered medications on file prior to encounter.    Current Outpatient Medications on File Prior to Encounter  Medication Sig Dispense Refill  . ferrous sulfate 325 (65 FE) MG tablet Take 325 mg by mouth 3 (three) times daily with meals.    Marland Kitchen ibuprofen (ADVIL,MOTRIN) 200 MG tablet Take 800 mg by mouth every 6 (six) hours as needed (for pain or headaches).    . megestrol (MEGACE) 40 MG tablet Take 1 tablet (40 mg total) by mouth 2 (two) times daily. Can increase to two tablets twice a day in the event of heavy bleeding 60 tablet 5    Objective: BP (!) 155/58   Pulse 87   Temp 98.3 F (36.8 C) (Oral)   Resp (!) 25   LMP  (LMP Unknown)   SpO2 98%  Exam: GEN: Morbidly obese.  Lying in bed without distress.  Able to sit up in bed for physical exam without difficulty. Head: normocephalic and atraumatic  Eyes: conjunctival pallor CVS: RRR, nl s1 & s2, 2/6 SEM over RUSB and LUSB murmurs, 2+ edema bilaterally RESP: no IWOB, good air movement bilaterally, CTAB GI: BS present & normal, soft, NTND.  Limited exam due to body habitus GU: no suprapubic or CVA tenderness.  MSK: Huge mass over her left thigh.  SKIN: Circular skin ulcer about 1 inch in diameter over the  anterior aspect of his right shin with surrounding skin erythema.  No increased warmth to touch.  No fluctuance.   NEURO: alert and oiented appropriately, no gross deficits   PSYCH: euthymic mood with congruent affect  Labs and Imaging: CBC BMET  Recent Labs  Lab 02/06/18 1421  WBC 9.1  HGB 4.4*  HCT 19.0*  PLT 281   Recent Labs  Lab 02/06/18 1421  NA 143  K 3.8  CL 109  CO2 24  BUN 12  CREATININE 1.23*  GLUCOSE 104*  CALCIUM 9.1      Mercy Riding, MD 02/06/2018, 7:54 PM PGY-3, [place] Intern pager: [phone], text pages welcome

## 2018-02-06 NOTE — ED Provider Notes (Signed)
[facility] EMERGENCY DEPARTMENT Provider Note   CSN: [id number] Arrival date & time: 02/06/18  1337   History   Chief Complaint Chief Complaint  Patient presents with  . Anemia    HPI Alexandra Morales is a 47 y.o. female.  The history is provided by the patient.   47 yo F with PMHx of PCOS, endometrial CA s/p D&C, mirena IUD placement 2015, DUB requiring transfusions in past who presents with worsening vaginal bleeding for last 7 months. States bleeding is occurring roughly every 2 weeks, at her heaviest she will bleed through 6 pads/hour. Accompanied by worsening fatigue, dyspnea with exertion, lightheadedness, right lower pelvic cramping. Was lost to f/u after procedure in 2015, was seen by OB/gyn yesterday, started on megestrol. Labs drawn yesterday, discovered to have hgb 4.4, told to come to ED by Ob/Gyn. Denies anticoagulation, hematochezia, melena, or any other bleeding. Denies chest pain.   Past Medical History:  Diagnosis Date  . Anemia   . Cancer ([place])   . Endometrial cancer ([place]) 09/01/2014  . History of blood transfusion 04/26/2014   [facility] - 5 Units transfused  . Hypertension   . PCOS (polycystic ovarian syndrome)    tx with metformin    Patient Active Problem List   Diagnosis Date Noted  . Venous insufficiency   . Endometrial cancer ([place]) 09/01/2014  . Abnormal uterine bleeding (AUB) 08/22/2014  . Severe obesity (BMI >= 40) ([place]) 05/26/2014  . Menorrhagia with irregular cycle 05/26/2014  . Hypertension 05/03/2014  . Edema 05/03/2014  . PCOS (polycystic ovarian syndrome) 04/28/2014  . Symptomatic anemia 04/26/2014  . Anemia in neoplastic disease 04/26/2014    Past Surgical History:  Procedure Laterality Date  . CHOLECYSTECTOMY    . CORNEAL TRANSPLANT Right   . HYSTEROSCOPY W/D&C  08/22/2014   Procedure: DILATATION AND CURETTAGE /HYSTEROSCOPY, Exam Under Anesthesia, Pap Smear, Mirena Insertion;  Surgeon: Lahoma Crocker, MD;  Location: [facility];  Service: Gynecology;;  . Arnetha Courser TOOTH EXTRACTION       OB History    Gravida  0   Para  0   Term  0   Preterm  0   AB  0   Living  0     SAB  0   TAB  0   Ectopic  0   Multiple  0   Live Births               Home Medications    Prior to Admission medications   Medication Sig Start Date End Date Taking? Authorizing Provider  ferrous sulfate 325 (65 FE) MG tablet Take 325 mg by mouth 3 (three) times daily with meals.   Yes [provider]  ibuprofen (ADVIL,MOTRIN) 200 MG tablet Take 800 mg by mouth every 6 (six) hours as needed (for pain or headaches).   Yes [provider]  megestrol (MEGACE) 40 MG tablet Take 1 tablet (40 mg total) by mouth 2 (two) times daily. Can increase to two tablets twice a day in the event of heavy bleeding 02/05/18  Yes Sloan Leiter, MD    Family History Family History  Problem Relation Age of Onset  . Cancer Mother     Social History Social History   Tobacco Use  . Smoking status: Never Smoker  . Smokeless tobacco: Never Used  Substance Use Topics  . Alcohol use: Yes    Alcohol/week: 0.0 oz    Comment: occasionally   . Drug use: No  Allergies   Tape   Review of Systems Review of Systems  Constitutional: Positive for fatigue. Negative for chills and fever.  HENT: Negative for nosebleeds and sore throat.   Eyes: Negative for pain and visual disturbance.  Respiratory: Positive for shortness of breath. Negative for cough.   Cardiovascular: Negative for chest pain and palpitations.  Gastrointestinal: Negative for abdominal pain, blood in stool, nausea and vomiting.  Genitourinary: Positive for menstrual problem, pelvic pain and vaginal bleeding. Negative for dysuria, hematuria and vaginal discharge.  Musculoskeletal: Negative for arthralgias and back pain.  Skin: Negative for color change and rash.  Neurological: Positive for light-headedness. Negative for seizures and syncope.  All other  systems reviewed and are negative.    Physical Exam Updated Vital Signs BP (!) 161/62 (BP Location: Left Arm)   Pulse 81   Temp 98.1 F (36.7 C) (Oral)   Resp 18   Ht 5\' 7"  (1.702 m)   Wt (!) 212.1 kg (467 lb 11.2 oz)   LMP  (LMP Unknown)   SpO2 92%   BMI 73.25 kg/m   Physical Exam  Constitutional: She is oriented to person, place, and time. She appears well-developed and well-nourished. No distress.  Morbidly obese  HENT:  Head: Normocephalic and atraumatic.  Mouth/Throat: Oropharynx is clear and moist.  Eyes:  Pale conjunctiva bilaterally  Neck: Neck supple.  Cardiovascular: Normal rate, regular rhythm and intact distal pulses.  Murmur heard.  Systolic murmur is present with a grade of 3/6. Pulmonary/Chest: Effort normal and breath sounds normal. No stridor. No respiratory distress.  Abdominal: Soft. She exhibits no distension. There is no tenderness. There is no rebound and no guarding.  Genitourinary:  Genitourinary Comments: Deferred  Musculoskeletal: She exhibits no edema.  Neurological: She is alert and oriented to person, place, and time.  Skin: Skin is warm and dry. There is pallor.  Psychiatric: She has a normal mood and affect.  Nursing note and vitals reviewed.    ED Treatments / Results  Labs (all labs ordered are listed, but only abnormal results are displayed) Labs Reviewed  COMPREHENSIVE METABOLIC PANEL - Abnormal; Notable for the following components:      Result Value   Glucose, Bld 104 (*)    Creatinine, Ser 1.23 (*)    Albumin 3.3 (*)    AST 9 (*)    ALT 10 (*)    GFR calc non Af Amer 52 (*)    All other components within normal limits  CBC - Abnormal; Notable for the following components:   RBC 2.59 (*)    Hemoglobin 4.4 (*)    HCT 19.0 (*)    MCV 73.4 (*)    MCH 17.0 (*)    MCHC 23.2 (*)    RDW 23.1 (*)    All other components within normal limits  MRSA PCR SCREENING  BASIC METABOLIC PANEL  CBC WITH DIFFERENTIAL/PLATELET  APTT    FERRITIN  HIV ANTIBODY (ROUTINE TESTING)  IRON AND TIBC  PROTIME-INR  RETICULOCYTES  FOLATE  VITAMIN B12  POC OCCULT BLOOD, ED  TYPE AND SCREEN  PREPARE RBC (CROSSMATCH)    EKG None  Radiology No results found.  Procedures Procedures (including critical care time)  Medications Ordered in ED Medications  acetaminophen (TYLENOL) tablet 650 mg (has no administration in time range)    Or  acetaminophen (TYLENOL) suppository 650 mg (has no administration in time range)  cephALEXin (KEFLEX) capsule 500 mg (500 mg Oral Given 02/06/18 2330)  megestrol (MEGACE)  tablet 40 mg (has no administration in time range)  lisinopril (PRINIVIL,ZESTRIL) tablet 10 mg (has no administration in time range)    And  hydrochlorothiazide (MICROZIDE) capsule 12.5 mg (has no administration in time range)  0.9 %  sodium chloride infusion ( Intravenous New Bag/Given 02/06/18 2308)     Initial Impression / Assessment and Plan / ED Course  I have reviewed the triage vital signs and the nursing notes.  Pertinent labs & imaging results that were available during my care of the patient were reviewed by me and considered in my medical decision making (see chart for details).     Alexandra Morales is a 47 y.o. female with PMHx of  endometrial CA s/p D&C, mirena IUD placement 2015, DUB requiring transfusions in past who presents with worsening vaginal bleeding for last 7 months. hgb performed as outpt y/d 4.4.  Reviewed and confirmed nursing documentation for past medical history, family history, social history. VS afebrile, wnl. Exam remarkable for pale appearance, SEM likely 2/2 anemia. Anemia likely blood loss given h/o heavy vaginal bleeding for months.   CMP with stable CKD, Cr. 1.23. CBC with microcytic anemia with hgb 4.4, stable from y/d. Last checked 2015 at 7.6. Type and screen sent. 2U PRBCs ordered for transfusion.   Discussed case with OB/Gyn who reviewed lab work and recent w/u with other OB  providers. At this time, they agree with plan for transfusion, but believe pelvic u/s and rest of workup can be performed as outpt. Will not admit at this time.   Old records reviewed. Labs reviewed by me and used in the medical decision making. Admitted to family medicine.   Final Clinical Impressions(s) / ED Diagnoses   Final diagnoses:  Dysfunctional uterine bleeding  Symptomatic anemia  Abnormal uterine bleeding (AUB)    ED Discharge Orders    None       Norm Salt, MD 02/07/18 0111    Lacretia Leigh, MD 02/08/18 [phone]

## 2018-02-06 NOTE — ED Triage Notes (Signed)
Patient sent to ED for hemoglobin of 4.4 yesterday. States she went to OB/GYN due to weakness and anemia found on CBC yesterday. Vital signs within normal limits. Patient alert, oriented, and in no apparent distress.

## 2018-02-06 NOTE — Telephone Encounter (Signed)
Called pt and pts roommate to try to notify pt of critical results, advised pt to call back ASAP.

## 2018-02-06 NOTE — Telephone Encounter (Signed)
Pt called back and I advised her to go to the hospital closest to her and receive an iron transfusion per Dr.s orders based on her critically low H&H. I reviewed those results with her and advised her to go to the emergency room and let them know she needs an iron transfusion. Advised pt to give the hospital our contact info if we need to send lab results over for them. Pt verbalized understanding and stated she would leave work and go over to the hospital.

## 2018-02-06 NOTE — Addendum Note (Signed)
Addended by: Vivien Rota on: 02/06/2018 12:19 PM   Modules accepted: Orders

## 2018-02-06 NOTE — Telephone Encounter (Signed)
Pt called and left voicemail stating she is driving on her way to [facility] hospital to get the blood transfusion.

## 2018-02-06 NOTE — Telephone Encounter (Signed)
Called patient to review critically low H/H and inform her to present to [facility] for blood transfusion, called twice with no answer, left message for patient to call office as soon as she gets message. Will try again.   Feliz Beam, M.D. Attending [place], [facility] for [employer], [place]

## 2018-02-07 ENCOUNTER — Observation Stay (HOSPITAL_COMMUNITY): Payer: BLUE CROSS/BLUE SHIELD

## 2018-02-07 ENCOUNTER — Encounter (HOSPITAL_COMMUNITY): Payer: Self-pay

## 2018-02-07 DIAGNOSIS — N938 Other specified abnormal uterine and vaginal bleeding: Secondary | ICD-10-CM

## 2018-02-07 DIAGNOSIS — D500 Iron deficiency anemia secondary to blood loss (chronic): Secondary | ICD-10-CM

## 2018-02-07 DIAGNOSIS — R2242 Localized swelling, mass and lump, left lower limb: Secondary | ICD-10-CM | POA: Diagnosis not present

## 2018-02-07 DIAGNOSIS — D649 Anemia, unspecified: Secondary | ICD-10-CM | POA: Diagnosis not present

## 2018-02-07 DIAGNOSIS — N939 Abnormal uterine and vaginal bleeding, unspecified: Secondary | ICD-10-CM | POA: Diagnosis not present

## 2018-02-07 LAB — BASIC METABOLIC PANEL
Anion gap: 7 (ref 5–15)
BUN: 11 mg/dL (ref 6–20)
CHLORIDE: 108 mmol/L (ref 101–111)
CO2: 26 mmol/L (ref 22–32)
Calcium: 8.6 mg/dL — ABNORMAL LOW (ref 8.9–10.3)
Creatinine, Ser: 1.03 mg/dL — ABNORMAL HIGH (ref 0.44–1.00)
GFR calc Af Amer: >60 mL/min (ref >60)
GFR calc non Af Amer: >60 mL/min (ref >60)
Glucose, Bld: 90 mg/dL (ref 65–99)
POTASSIUM: 3.4 mmol/L — AB (ref 3.5–5.1)
SODIUM: 141 mmol/L (ref 135–145)

## 2018-02-07 LAB — RETICULOCYTES
RBC.: 2.94 MIL/uL — AB (ref 3.87–5.11)
RETIC CT PCT: 0.8 % (ref 0.4–3.1)
Retic Count, Absolute: 23.5 10*3/uL (ref 19.0–186.0)

## 2018-02-07 LAB — CBC WITH DIFFERENTIAL/PLATELET
Basophils Absolute: 0 10*3/uL (ref 0.0–0.1)
Basophils Relative: 0 %
EOS PCT: 2 %
Eosinophils Absolute: 0.1 10*3/uL (ref 0.0–0.7)
HEMATOCRIT: 21.8 % — AB (ref 36.0–46.0)
HEMOGLOBIN: 5.6 g/dL — AB (ref 12.0–15.0)
LYMPHS ABS: 1 10*3/uL (ref 0.7–4.0)
Lymphocytes Relative: 15 %
MCH: 19 pg — ABNORMAL LOW (ref 26.0–34.0)
MCHC: 25.7 g/dL — ABNORMAL LOW (ref 30.0–36.0)
MCV: 74.1 fL — AB (ref 78.0–100.0)
MONOS PCT: 7 %
Monocytes Absolute: 0.5 10*3/uL (ref 0.1–1.0)
Neutro Abs: 4.9 10*3/uL (ref 1.7–7.7)
Neutrophils Relative %: 76 %
Platelets: 258 10*3/uL (ref 150–400)
RBC: 2.94 MIL/uL — AB (ref 3.87–5.11)
RDW: 22.6 % — AB (ref 11.5–15.5)
WBC: 6.5 10*3/uL (ref 4.0–10.5)

## 2018-02-07 LAB — HIV ANTIBODY (ROUTINE TESTING W REFLEX): HIV SCREEN 4TH GENERATION: NONREACTIVE

## 2018-02-07 LAB — APTT: aPTT: 32 seconds (ref 24–36)

## 2018-02-07 LAB — FERRITIN: Ferritin: 4 ng/mL — ABNORMAL LOW (ref 11–307)

## 2018-02-07 LAB — IRON AND TIBC
IRON: 15 ug/dL — AB (ref 28–170)
SATURATION RATIOS: 3 % — AB (ref 10.4–31.8)
TIBC: 494 ug/dL — ABNORMAL HIGH (ref 250–450)
UIBC: 479 ug/dL

## 2018-02-07 LAB — FOLATE: FOLATE: 6.9 ng/mL (ref >5.9)

## 2018-02-07 LAB — PREPARE RBC (CROSSMATCH)

## 2018-02-07 LAB — MRSA PCR SCREENING: MRSA BY PCR: NEGATIVE

## 2018-02-07 LAB — PROTIME-INR
INR: 1.22
Prothrombin Time: 15.3 seconds — ABNORMAL HIGH (ref 11.4–15.2)

## 2018-02-07 LAB — VITAMIN B12: Vitamin B-12: 192 pg/mL (ref 180–914)

## 2018-02-07 MED ORDER — DIAZEPAM 5 MG PO TABS
5.0000 mg | ORAL_TABLET | Freq: Once | ORAL | Status: AC
  Administered 2018-02-08: 5 mg via ORAL
  Filled 2018-02-07: qty 1

## 2018-02-07 MED ORDER — SODIUM CHLORIDE 0.9 % IV SOLN
Freq: Once | INTRAVENOUS | Status: AC
  Administered 2018-02-07: 11:00:00 via INTRAVENOUS

## 2018-02-07 MED ORDER — POTASSIUM CHLORIDE CRYS ER 20 MEQ PO TBCR
40.0000 meq | EXTENDED_RELEASE_TABLET | Freq: Once | ORAL | Status: AC
  Administered 2018-02-07: 40 meq via ORAL
  Filled 2018-02-07: qty 2

## 2018-02-07 NOTE — Progress Notes (Addendum)
Family Medicine Teaching Service Daily Progress Note Intern Pager: [phone]  Patient name: Alexandra Morales record number: [id number] Date of birth: 11/05/1970 Age: 47 y.o. Gender: female  Primary Care Provider: Bufford Lope, DO Consultants: none Code Status: full  Pt Overview and Major Events to Date:   Assessment and Plan: Alexandra Morales is a 47 y.o. female presenting with symptomatic anemia. PMH is significant for dysfunctional uterine bleeding, endometrial adenocarcinoma status post D/C in 2015, hypertension (not on medication), venous insufficiency and morbid obesity  Symptomatic anemia: symptoms resolved after 2 units of blood.  Hemoglobin up from 4.4 to 5.6. Anemia due to dysfunctional uterine bleeding.  Currently no bleeding for the last 4 days.  Appears to have good bone marrow response with RDW of 23.1.  Hemodynamically stable.  PT/INR normal.  Anemia panel with very low iron saturation and ferritin level even after 2 units of blood. -Transfuse 2 more units -Posttransfusion H&H -Feraheme after blood transfusion. -TVUS  Dysfunctional uterine bleeding: chronic issue.  Currently no bleeding for the last 4 days.  Seen by GYN yesterday, started on Megace and referred to GYN/Onc. -Continue Megace -Transvaginal ultrasound  Endometrial adenocarcinoma: Status post D and C in 2015.  Pathology with FIGO grade I endometrial adenocarcinoma, endometrioid type.  -Transvaginal ultrasound -Outpatient follow-up  Hypertension: Chronic issue.  BP slightly elevated in ED.  Has not been on medication for the last 4 years.  Takes daily ibuprofen. -Start low-dose HCTZ -May add ACE inhibitors if renal function stable -Discourage NSAIDs  ?CKD-3: serum creatinine 1.03.  1.23 on admission. 1.27 about 3 years ago.  Reports taking ibuprofen daily.  Not on medication for hypertension.  Hypokalemia: K3.4.  -K-Dur 401  Venous insufficiency: Chronic issue.  Bilateral edema -Encourage leg  elevation -Compression stocking  Right leg wound: Circular traumatic right leg wound about 1 inch in diameter with surrounding skin erythema concerning for cellulitis.  No increased warmth to touch.  No fluctuance.  -Keflex 500 mg 3 times daily -Manage venous insufficiency as above  Left thigh mass: huge mass over her left thigh likely fatty tissue.  Slowly growing per patient. -May need MRI  FEN/GI: -Regular diet  Prophylaxis: -SCD  Disposition: Observe in MedSurg for symptomatic anemia and transfusion.  Subjective:  No acute events overnight.  Received 2 units of blood transfusion.  Ambulated in the room without symptoms.  Objective: Temp:  [98.1 F (36.7 C)-99.8 F (37.7 C)] 98.1 F (36.7 C) (06/15 0454) Pulse Rate:  [79-106] 82 (06/15 0454) Resp:  [16-22] 18 (06/15 0454) BP: (112-171)/(40-72) 144/72 (06/15 0454) SpO2:  [91 %-100 %] 95 % (06/15 0454) Weight:  [467 lb 11.2 oz (212.1 kg)] 467 lb 11.2 oz (212.1 kg) (06/14 2135) Physical Exam: GEN: Morbidly obese.  Lying in bed without distress.  Able to sit up in bed for physical exam without difficulty. Head: normocephalic and atraumatic  Eyes: conjunctival pallor CVS: RRR, nl s1 & s2, 2+ edema bilaterally RESP: no IWOB, good air movement bilaterally, CTAB GI: BS present & normal, soft, NTND.  Limited exam due to body habitus GU: no suprapubic or CVA tenderness.  MSK: Huge mass over her left thigh.  SKIN: Circular skin ulcer about 1 inch in diameter over the anterior aspect of his right shin with surrounding skin erythema.  No increased warmth to touch.  No fluctuance. NEURO: alert and oiented appropriately, no gross deficits  PSYCH: euthymic mood with congruent affect  Laboratory: Recent Labs  Lab 02/05/18 1536 02/06/18 1421  02/07/18 0508  WBC 8.0 9.1 6.5  HGB 4.4* 4.4* 5.6*  HCT 16.6* 19.0* 21.8*  PLT 270 281 258   Recent Labs  Lab 02/05/18 1536 02/06/18 1421 02/07/18 0508  NA 142 143 141  K 4.0  3.8 3.4*  CL 107* 109 108  CO2 23 24 26   BUN 13 12 11   CREATININE 1.21* 1.23* 1.03*  CALCIUM 8.9 9.1 8.6*  PROT 6.4 6.6  --   BILITOT 0.3 0.4  --   ALKPHOS 49 45  --   ALT 7 10*  --   AST 10 9*  --   GLUCOSE 98 104* 90    Imaging/Diagnostic Tests:   Mercy Riding, MD 02/07/2018, 10:00 AM PGY-3, [place] Intern pager: [phone], text pages welcome

## 2018-02-07 NOTE — Progress Notes (Signed)
Pt arrived to 5W07 alert from ED with first UPRBCs infusing. Pt admitted for low hemoglobin and ordered to transfuse 2 UPRBCs at this time. Wound noted to RLE cleansed and left OTA. Pt oriented to room, call bell, and unit routines.

## 2018-02-07 NOTE — Progress Notes (Signed)
At bedside to place PIV per request.  Pt not in room at this time.

## 2018-02-07 NOTE — Progress Notes (Signed)
Patient off unit to US

## 2018-02-07 NOTE — Progress Notes (Signed)
First unit of Blood transfused: IV Now leaking when being flushed even with dressing change. IV team consult placed due to this RN being unable to obtain IV.

## 2018-02-07 NOTE — Progress Notes (Signed)
Patient returned to unit from [place] patient Still does not have IV. Repeat IV Consult Placed.

## 2018-02-08 ENCOUNTER — Observation Stay (HOSPITAL_COMMUNITY): Payer: BLUE CROSS/BLUE SHIELD

## 2018-02-08 DIAGNOSIS — N938 Other specified abnormal uterine and vaginal bleeding: Secondary | ICD-10-CM | POA: Diagnosis not present

## 2018-02-08 DIAGNOSIS — M799 Soft tissue disorder, unspecified: Secondary | ICD-10-CM | POA: Diagnosis not present

## 2018-02-08 DIAGNOSIS — D649 Anemia, unspecified: Secondary | ICD-10-CM | POA: Diagnosis not present

## 2018-02-08 LAB — TYPE AND SCREEN
ABO/RH(D): A POS
Antibody Screen: NEGATIVE
UNIT DIVISION: 0
UNIT DIVISION: 0
Unit division: 0
Unit division: 0

## 2018-02-08 LAB — CBC
HEMATOCRIT: 25.5 % — AB (ref 36.0–46.0)
HEMOGLOBIN: 7 g/dL — AB (ref 12.0–15.0)
MCH: 20.4 pg — ABNORMAL LOW (ref 26.0–34.0)
MCHC: 27.5 g/dL — AB (ref 30.0–36.0)
MCV: 74.3 fL — ABNORMAL LOW (ref 78.0–100.0)
Platelets: 251 10*3/uL (ref 150–400)
RBC: 3.43 MIL/uL — AB (ref 3.87–5.11)
RDW: 22.6 % — ABNORMAL HIGH (ref 11.5–15.5)
WBC: 8.3 10*3/uL (ref 4.0–10.5)

## 2018-02-08 LAB — BASIC METABOLIC PANEL
Anion gap: 6 (ref 5–15)
BUN: 12 mg/dL (ref 6–20)
CO2: 23 mmol/L (ref 22–32)
Calcium: 8.2 mg/dL — ABNORMAL LOW (ref 8.9–10.3)
Chloride: 109 mmol/L (ref 101–111)
Creatinine, Ser: 1 mg/dL (ref 0.44–1.00)
GFR calc non Af Amer: >60 mL/min (ref >60)
Glucose, Bld: 110 mg/dL — ABNORMAL HIGH (ref 65–99)
POTASSIUM: 3.6 mmol/L (ref 3.5–5.1)
SODIUM: 138 mmol/L (ref 135–145)

## 2018-02-08 LAB — BPAM RBC
BLOOD PRODUCT EXPIRATION DATE: 201906212359
BLOOD PRODUCT EXPIRATION DATE: 201907052359
BLOOD PRODUCT EXPIRATION DATE: 201907052359
Blood Product Expiration Date: 201906212359
ISSUE DATE / TIME: 201906141931
ISSUE DATE / TIME: 201906142241
ISSUE DATE / TIME: 201906151043
ISSUE DATE / TIME: 201906152010
UNIT TYPE AND RH: 6200
UNIT TYPE AND RH: 6200
Unit Type and Rh: 6200
Unit Type and Rh: 6200

## 2018-02-08 MED ORDER — IOHEXOL 300 MG/ML  SOLN
100.0000 mL | Freq: Once | INTRAMUSCULAR | Status: AC | PRN
  Administered 2018-02-08: 100 mL via INTRAVENOUS

## 2018-02-08 MED ORDER — SODIUM CHLORIDE 0.9 % IV SOLN
510.0000 mg | Freq: Once | INTRAVENOUS | Status: AC
  Administered 2018-02-08: 510 mg via INTRAVENOUS
  Filled 2018-02-08: qty 17

## 2018-02-08 MED ORDER — CEPHALEXIN 500 MG PO CAPS: 500.0000 mg | ORAL_CAPSULE | Freq: Three times a day (TID) | ORAL | 0 refills | Status: AC

## 2018-02-08 NOTE — Progress Notes (Signed)
02/08/18 @ 09:42. Pt refused exam due to claustrophobia even after given mild sedation.

## 2018-02-08 NOTE — Discharge Instructions (Signed)
Please take Keflex three times per day for your skin infection.  The last day of antibiotics should be on 6/21.  If you feel weak or dizzy, please call your ob/gyn or come to the Emergency Department, since your blood level may have dropped lower, and this can become dangerous if it drops too low.  Please also follow up with your ob/gyn on Thursday.  You would benefit from follow up with primary care as well to aid in your overall health.  Please make an appointment with [facility] or another primary care office for hospital follow up in the next 1-2 weeks.

## 2018-02-08 NOTE — Progress Notes (Signed)
Nsg Discharge Note  Admit Date:  02/06/2018 Discharge date: 02/08/2018   Alexandra Morales to be D/C'd Home per MD order.  AVS completed.  Copy for chart, and copy for patient signed, and dated. Patient/caregiver able to verbalize understanding.  Discharge Medication: Allergies as of 02/08/2018      Reactions   Tape Rash   Paper tape is tolerated best      Medication List    TAKE these medications   cephALEXin 500 MG capsule Commonly known as:  KEFLEX Take 1 capsule (500 mg total) by mouth every 8 (eight) hours for 4 days.   ferrous sulfate 325 (65 FE) MG tablet Take 325 mg by mouth 3 (three) times daily with meals.   ibuprofen 200 MG tablet Commonly known as:  ADVIL,MOTRIN Take 800 mg by mouth every 6 (six) hours as needed (for pain or headaches).   megestrol 40 MG tablet Commonly known as:  MEGACE Take 1 tablet (40 mg total) by mouth 2 (two) times daily. Can increase to two tablets twice a day in the event of heavy bleeding       Discharge Assessment: Vitals:   02/08/18 0527 02/08/18 1326  BP: 140/65 (!) 144/59  Pulse: 76 75  Resp: 17 14  Temp: 98.2 F (36.8 C) 99 F (37.2 C)  SpO2: 94% 97%   Skin clean, dry and intact without evidence of skin break down, no evidence of skin tears noted. IV catheter discontinued intact. Site without signs and symptoms of complications - no redness or edema noted at insertion site, patient denies c/o pain - only slight tenderness at site.  Dressing with slight pressure applied.  D/c Instructions-Education: Discharge instructions given to patient/family with verbalized understanding. D/c education completed with patient/family including follow up instructions, medication list, d/c activities limitations if indicated, with other d/c instructions as indicated by MD - patient able to verbalize understanding, all questions fully answered. Patient instructed to return to ED, call 911, or call MD for any changes in condition.  Patient  escorted via [place], and D/C home via private auto.  [place] N Adham Johnson, RN 02/08/2018 6:44 PM

## 2018-02-08 NOTE — Progress Notes (Signed)
Family Medicine Teaching Service Daily Progress Note Intern Pager: [phone]  Patient name: Alexandra Morales record number: [id number] Date of birth: 1971-05-09 Age: 47 y.o. Gender: female  Primary Care Provider: Bufford Lope, DO Consultants: none Code Status: full  Pt Overview and Major Events to Date:   Assessment and Plan: Alexandra Morales is a 47 y.o. female presenting with symptomatic anemia. PMH is significant for dysfunctional uterine bleeding, endometrial adenocarcinoma status post D/C in 2015, hypertension (not on medication), venous insufficiency and morbid obesity  Symptomatic anemia: symptoms resolved after 2 units of blood; however, required two more units on 6/15 after hemoglobin rose only to 5.6. Now 7.0 on 6/16.  Continues to be hemodynamically stable.  PT/INR normal.  TVUS attempted on 6/15 but could not visualize endometrial stripe or ovaries due to body habitus -IV Feraheme before discharge -close follow up with ob/gyn, will need further imaging  Dysfunctional uterine bleeding: chronic issue.  Currently no bleeding for the last 5 days.  Seen by GYN yesterday, started on Megace and referred to GYN/Onc. -Continue Megace  Endometrial adenocarcinoma: Status post D and C in 2015.  Pathology with FIGO grade I endometrial adenocarcinoma, endometrioid type.  -Outpatient follow-up  Hypertension: Chronic issue.  BP intemittently elevated; currently 140/65 on 6/16.  Has not been on medication for the last 4 years.  Takes daily ibuprofen. -continue low-dose HCTZ -May add ACE inhibitors if renal function stable -Discourage NSAIDs  Hypokalemia, resolved: K 3.6.  -K-Dur 401  Venous insufficiency: Chronic issue.  Bilateral edema -Encourage leg elevation -Compression stocking  Right leg wound: Circular traumatic right leg wound about 1 inch in diameter with surrounding skin erythema concerning for cellulitis.  No increased warmth to touch.  No fluctuance.  -Keflex 500  mg 3 times daily, end date 6/20 -Manage venous insufficiency as above  Left thigh mass: huge mass over her left thigh likely fatty tissue.  Slowly growing per patient.  Imaging should be obtained prior to d/c due to patient's low likelihood of follow up. -MRI ordered; may not fit in scanner due to body habitus -will do CT if MRI is not possible  FEN/GI: -Regular diet  Prophylaxis: -SCD  Disposition: hopeful d/c home 6/16 after transfusion and MRI  Subjective:  Feels well this morning and is hoping to go home today.  Has follow up with ob/gyn.  She says that she does not like to go to doctors because they mostly focus on her weight.  She says she would be willing to follow up in our clinic.  Objective: Temp:  [97.9 F (36.6 C)-99.1 F (37.3 C)] 98.2 F (36.8 C) (06/16 0527) Pulse Rate:  [67-80] 76 (06/16 0527) Resp:  [16-18] 17 (06/16 0527) BP: (112-162)/(52-92) 140/65 (06/16 0527) SpO2:  [94 %-100 %] 94 % (06/16 0527) Physical Exam: GEN: Morbidly obese.  Lying in bed without distress, appears comfortable  CVS: RRR, no MRG, 2+ edema bilaterally RESP: CTAB, no wheezes, rales, or rhonchi GI: BS present & normal, soft, NTND.  Limited exam due to body habitus MSK: Huge mass over the medial side of her left thigh, slightly erythematous.  SKIN: Circular skin ulcer about 1 inch in diameter over the anterior aspect of his right shin with surrounding skin erythema.  No increased warmth to touch.  No fluctuance. NEURO: alert and oiented appropriately, no gross deficits  PSYCH: euthymic mood with congruent affect  Laboratory: Recent Labs  Lab 02/06/18 1421 02/07/18 0508 02/08/18 0219  WBC 9.1 6.5 8.3  HGB 4.4* 5.6* 7.0*  HCT 19.0* 21.8* 25.5*  PLT 281 258 251   Recent Labs  Lab 02/05/18 1536 02/06/18 1421 02/07/18 0508 02/08/18 0219  NA 142 143 141 138  K 4.0 3.8 3.4* 3.6  CL 107* 109 108 109  CO2 23 24 26 23   BUN 13 12 11 12   CREATININE 1.21* 1.23* 1.03* 1.00   CALCIUM 8.9 9.1 8.6* 8.2*  PROT 6.4 6.6  --   --   BILITOT 0.3 0.4  --   --   ALKPHOS 49 45  --   --   ALT 7 10*  --   --   AST 10 9*  --   --   GLUCOSE 98 104* 90 110*    Imaging/Diagnostic Tests:   Kathrene Alu, MD 02/08/2018, 6:38 AM PGY-1, [place] Intern pager: [phone], text pages welcome

## 2018-02-09 NOTE — Discharge Summary (Signed)
[facility] Discharge Summary  Patient name: Alexandra Morales record number: [id number] Date of birth: 1970/10/21 Age: 47 y.o. Gender: female Date of Admission: 02/06/2018  Date of Discharge: 02/08/18 Admitting Physician: Guadalupe Dawn, MD  Primary Care Provider: Bufford Lope, DO Consultants: none  Indication for Hospitalization: Anemia due to abnormal uterine bleeding  Discharge Diagnoses/Problem List:  Symptomatic anemia Dysfunctional uterine bleeding Endometrial carcinoma, stage 1 Hypertension Venous insufficiency Right leg wound Left thigh mass  Disposition: home  Discharge Condition: stable, improved  Discharge Exam:  UXN:ATFTDDUK obese. Lying in bed without distress, appears comfortable  CVS: RRR, no MRG,2+ edema bilaterally RESP: CTAB, no wheezes, rales, or rhonchi GI: BS present & normal, soft, NTND.Limited exam due to body habitus GUR:KYHC mass over the medial side of her left thigh, slightly erythematous. SKIN:Circular skin ulcer about 1 inch in diameter over the anterior aspect of his right shin with surrounding skin erythema. No increased warmth to touch. No fluctuance. NEURO: alert and oiented appropriately, no gross deficits  PSYCH: euthymic mood with congruent affect  Brief Hospital Course:  Ms. Lieder was admitted on 02/06/18 for symptomatic anemia with a hemoglobin of 4.4.  Anemia panel was consistent with iron deficiency anemia.  She was transfused two units with a resulting post-transfusion hemoglobin of 5.4 on 6/15.  She was then transfused two more units with a post-transfusion hemoglobin of 7.0.  Patient felt much better after transfusions.  A transvaginal ultrasound was performed to evaluate the endometrial stripe, but visualization was impaired due to patient's body habitus.  She already had an OBGYN appointment the upcoming week, and we emphasized the importance of following up with them, especially given her  very low hemoglobin and history of endometrial cancer.  She was also noted to have slightly elevated blood pressures during admission and reported a history of hypertension without medication, so low dose HCTZ was started.  She was also started on Keflex for cellulitis of a traumatic right leg wound.    Patient had a very large mass on her medial L thigh that had recently increased in size, so an MRI was attempted for further evaluation since the patient was unlikely to follow up and get imaging done as an outpatient.  Patient could not tolerate the MRI, so a CT scan was performed instead and showed a primary fatty tumor.  Although this could be a lipoma, concern remains that it could be a liposarcoma instead, and patient was highly encouraged to follow up with PCP and get a biopsy of the tumor.  Issues for Follow Up:  1. Please obtain a CBC to track patient's hemoglobin.  Transfusion threshold should be Hgb 7.0. 2. Please obtain better imaging of patient's reproductive organs, since transvaginal ultrasound was not sufficient. 3. Please refer patient to get a biopsy of her leg tumor if she is amenable. 4. Please monitor R leg wound for healing.  Significant Procedures: blood transfusions x 4  Significant Labs and Imaging:  Recent Labs  Lab 02/06/18 1421 02/07/18 0508 02/08/18 0219  WBC 9.1 6.5 8.3  HGB 4.4* 5.6* 7.0*  HCT 19.0* 21.8* 25.5*  PLT 281 258 251   Recent Labs  Lab 02/05/18 1536 02/06/18 1421 02/07/18 0508 02/08/18 0219  NA 142 143 141 138  K 4.0 3.8 3.4* 3.6  CL 107* 109 108 109  CO2 23 24 26 23   GLUCOSE 98 104* 90 110*  BUN 13 12 11 12   CREATININE 1.21* 1.23* 1.03* 1.00  CALCIUM  8.9 9.1 8.6* 8.2*  ALKPHOS 49 45  --   --   AST 10 9*  --   --   ALT 7 10*  --   --   ALBUMIN 3.9 3.3*  --   --    Ct Femur Left W Contrast  Result Date: 02/08/2018 CLINICAL DATA:  Slowly growing mass lesion in the upper left thigh. EXAM: CT OF THE LOWER RIGHT EXTREMITY WITH CONTRAST  TECHNIQUE: Multidetector CT imaging of the lower right extremity was performed according to the standard protocol following intravenous contrast administration. COMPARISON:  None. CONTRAST:  100 ml OMNIPAQUE IOHEXOL 300 MG/ML  SOLN FINDINGS: This exam is limited by the patient's body habitus. Bones/Joint/Cartilage No acute or focal abnormality is identified. Degenerative change is seen about the knee and most notable in medial compartment. Ligaments Suboptimally assessed by CT. Muscles and Tendons Intact.  There is some fatty atrophy of all visualized musculature. Soft tissues There is a mass lesion in the medial aspect of the left upper leg measuring 30 cm AP x 20 cm transverse x 30 cm craniocaudal. The lesion is fat attenuating with extensive stranding throughout it. Cutaneous tissues about the mass are thickened. No focal fluid collection is identified. IMPRESSION: Fatty lesion in the medial aspect of the left thigh is most consistent with a lipoma. Extensive stranding throughout lipoma could be due to cellulitis or necrosis. Electronically Signed   By: Inge Rise M.D.   On: 02/08/2018 15:44   US Pelvic Complete With Transvaginal  Result Date: 02/07/2018 CLINICAL DATA:  Uterine bleeding.  Dysfunctional uterine bleeding. EXAM: TRANSABDOMINAL AND TRANSVAGINAL ULTRASOUND OF PELVIS TECHNIQUE: Both transabdominal and transvaginal ultrasound examinations of the pelvis were performed. Transabdominal technique was performed for global imaging of the pelvis including uterus, ovaries, adnexal regions, and pelvic cul-de-sac. It was necessary to proceed with endovaginal exam following the transabdominal exam to visualize the endometrium. COMPARISON:  None FINDINGS: Uterus Measurements: 13.2 x 8.6 x 9.9 cm. Heterogeneous echotexture. No mass. Endometrium Thickness: Endometrial stripe is difficult to measure. Endometrial stripe not clearly identified transabdominal or transvaginal. Right ovary Measurements: Not  identified. Left ovary Measurements: Not identified. Other findings No abnormal free fluid. IMPRESSION: 1. Exam limited in part due to patient body habitus. 2. The endometrial stripe is not confidently measurable on transabdominal or transvaginal imaging. Cannot exclude expansion of the endometrium in patient with uterine bleeding. Recommend non emergent OBGYN consultation with potential sonohysterogram. 3. Ovaries not identified as above. Electronically Signed   By: Suzy Bouchard M.D.   On: 02/07/2018 18:19    Results/Tests Pending at Time of Discharge: none  Discharge Medications:  Allergies as of 02/08/2018      Reactions   Tape Rash   Paper tape is tolerated best      Medication List    TAKE these medications   cephALEXin 500 MG capsule Commonly known as:  KEFLEX Take 1 capsule (500 mg total) by mouth every 8 (eight) hours for 4 days.   ferrous sulfate 325 (65 FE) MG tablet Take 325 mg by mouth 3 (three) times daily with meals.   ibuprofen 200 MG tablet Commonly known as:  ADVIL,MOTRIN Take 800 mg by mouth every 6 (six) hours as needed (for pain or headaches).   megestrol 40 MG tablet Commonly known as:  MEGACE Take 1 tablet (40 mg total) by mouth 2 (two) times daily. Can increase to two tablets twice a day in the event of heavy bleeding  Discharge Instructions: Please refer to Patient Instructions section of EMR for full details.  Patient was counseled important signs and symptoms that should prompt return to medical care, changes in medications, dietary instructions, activity restrictions, and follow up appointments.   Follow-Up Appointments: Follow-up Information    Winfrey, Alcario Drought, MD. Schedule an appointment as soon as possible for a visit in 1 week(s).   Specialty:  Family Medicine Why:  Please make an appointment for hospital follow up with me.  If I do not have any slots open, one of my colleagues will be happy to see you.  You can then see me for health  maintenance if you are interested. Contact information: [address] [place] [address] [phone]           Kathrene Alu, MD 02/09/2018, 1:56 PM PGY-1, [place]

## 2018-02-10 ENCOUNTER — Telehealth: Payer: Self-pay

## 2018-02-10 NOTE — Telephone Encounter (Signed)
TC from pt stating she has scheduled U/S this Thursday. However pt went to the hospital over the weekend and a Pelvic U/S was done on 02/07/18.   Pt wanted confirmation that she did not need to keep Thursday U/S.   I did consult w/ Dr.Arnold that she would not need to repeat pelvis U/S on Thursday   Please review report   and advise pt awaiting results.

## 2018-02-11 NOTE — Telephone Encounter (Signed)
Please review results and advise.  Thanks

## 2018-02-11 NOTE — Telephone Encounter (Signed)
Ok, Thanks

## 2018-02-12 ENCOUNTER — Ambulatory Visit (HOSPITAL_COMMUNITY): Payer: BLUE CROSS/BLUE SHIELD

## 2018-02-19 ENCOUNTER — Ambulatory Visit
Admission: RE | Admit: 2018-02-19 | Discharge: 2018-02-19 | Disposition: A | Discharge status: Home / Self Care | Payer: BLUE CROSS/BLUE SHIELD | Source: Ambulatory Visit | Attending: Obstetrics and Gynecology | Admitting: Obstetrics and Gynecology

## 2018-02-19 ENCOUNTER — Encounter: Payer: Self-pay | Admitting: Family Medicine

## 2018-02-19 ENCOUNTER — Ambulatory Visit (INDEPENDENT_AMBULATORY_CARE_PROVIDER_SITE_OTHER): Payer: BLUE CROSS/BLUE SHIELD | Admitting: Family Medicine

## 2018-02-19 ENCOUNTER — Other Ambulatory Visit: Payer: Self-pay

## 2018-02-19 VITALS — BP 158/70 | HR 102 | Temp 98.5°F | Ht 67.0 in | Wt >= 6400 oz

## 2018-02-19 DIAGNOSIS — D649 Anemia, unspecified: Secondary | ICD-10-CM

## 2018-02-19 DIAGNOSIS — Z1231 Encounter for screening mammogram for malignant neoplasm of breast: Secondary | ICD-10-CM | POA: Diagnosis not present

## 2018-02-19 DIAGNOSIS — Z947 Corneal transplant status: Secondary | ICD-10-CM | POA: Diagnosis not present

## 2018-02-19 DIAGNOSIS — C541 Malignant neoplasm of endometrium: Secondary | ICD-10-CM | POA: Diagnosis not present

## 2018-02-19 DIAGNOSIS — Z1239 Encounter for other screening for malignant neoplasm of breast: Secondary | ICD-10-CM

## 2018-02-19 DIAGNOSIS — D630 Anemia in neoplastic disease: Secondary | ICD-10-CM

## 2018-02-19 DIAGNOSIS — I10 Essential (primary) hypertension: Secondary | ICD-10-CM

## 2018-02-19 DIAGNOSIS — R2242 Localized swelling, mass and lump, left lower limb: Secondary | ICD-10-CM

## 2018-02-19 NOTE — Progress Notes (Signed)
CHIEF COMPLAINT / HPI: Follow-up symptomatic anemia And a few days after discharge so she increased her up to 4 tabs a day.  She remains on that dose.  She has not had any vaginal bleeding since.  Prior to admission she was having significant shortness of breath with even short distances walking.  She had to stop all the way into work and rest the morning after leaving her car.  Now she is not having those symptoms, she feels much better.  REVIEW OF SYSTEMS: Denies headache.  No chest pressure.  See HPI.  PERTINENT  PMH / PSH: I have reviewed the patient's medications, allergies, past medical and surgical history, smoking status and updated in the EMR as appropriate. 1. FIGO 1 Endometrial cancer treated with progesterone IUD placed in December 2015 2. Recent admission for symptomatic anemia requiring transfusion of 4 units blood 3. History of elevated blood pressure, unclear if she actually has prior diagnosis of hypertension 4. Family history significant for her mother at age 24 from breast cancer 5. Social history significant for never smoker 6.  2015 right corneal transplant  OBJECTIVE: Vitals as reviewed in elevated blood pressure 158/70 is noted.   General: Well-developed overweight female no acute distress Psych: Alert and oriented x4.  Affect is interactive.  Chart review/results review: CT scan of left femur/groin area February 08, 2018.  Regarding the left groin mass: There is a mass lesion in the medial aspect of the left upper leg measuring 30 cm AP x 20 cm transverse x 30 cm craniocaudal. The lesion is fat attenuating with extensive stranding throughout it. Cutaneous tissues about the mass are thickened. No focal fluid collection is identified.   Pelvic and transvaginal US:  Measurements: 13.2 x 8.6 x 9.9 cm. Heterogeneous echotexture. No mass. EndometriumThickness: Endometrial stripe is difficult to measure. Endometrial stripe not clearly identified transabdominal or  transvaginal. Right ovaryMeasurements: Not identified. Left ovaryMeasurements: Not identified. Other findingsNo abnormal free fluid. IMPRESSION: 1. Exam limited in part due to patient body habitus. 2. The endometrial stripe is not confidently measurable on transabdominal or transvaginal imaging. Cannot exclude expansion of the endometrium in patient with uterine bleeding. Recommend non emergent OBGYN consultation with potential sonohysterogram. 3. Ovaries not identified as above.  Iron panel:Results for Alexandra Morales, Alexandra Morales (MRN [id number]) as of 02/19/2018 11:41  Ref. Range 02/07/2018 05:08  Iron Latest Ref Range: 28 - 170 ug/dL 15 (L)  UIBC Latest Units: ug/dL 479  TIBC Latest Ref Range: 250 - 450 ug/dL 494 (H)  Saturation Ratios Latest Ref Range: 10.4 - 31.8 % 3 (L)  Ferritin Latest Ref Range: 11 - 307 ng/mL 4 (L)  Folate Latest Ref Range: >5.9 ng/mL 6.9  Results for Alexandra Morales, Alexandra Morales (MRN [id number]) as of 02/19/2018 11:41  Ref. Range 02/05/2018 15:36 02/06/2018 14:21 02/07/2018 05:08 02/08/2018 02:19  WBC Latest Ref Range: 4.0 - 10.5 K/uL 8.0 9.1 6.5 8.3  RBC Latest Ref Range: 3.87 - 5.11 MIL/uL 2.48 (LL) 2.59 (L) 2.94 (L) 3.43 (L)  Hemoglobin Latest Ref Range: 12.0 - 15.0 g/dL 4.4 (LL) 4.4 (LL) 5.6 (LL) 7.0 (L)  HCT Latest Ref Range: 36.0 - 46.0 % 16.6 (LL) 19.0 (L) 21.8 (L) 25.5 (L)  MCV Latest Ref Range: 78.0 - 100.0 fL 67 (L) 73.4 (L) 74.1 (L) 74.3 (L)  MCH Latest Ref Range: 26.0 - 34.0 pg 17.7 (L) 17.0 (L) 19.0 (L) 20.4 (L)  MCHC Latest Ref Range: 30.0 - 36.0 g/dL 26.5 (L) 23.2 (L) 25.7 (L) 27.5 (  L)  RDW Latest Ref Range: 11.5 - 15.5 % 22.7 (H) 23.1 (H) 22.6 (H) 22.6 (H)  Platelets Latest Ref Range: 150 - 400 K/uL 270 281 258 251    ASSESSMENT / PLAN: Please see problem oriented charting for details

## 2018-02-19 NOTE — Assessment & Plan Note (Signed)
Recheck hemoglobin today.  Should she develop symptoms of anemia like shortness of breath, she will let me know immediately.  Would consider outpatient transfusion.  For now we will continue current dose of Megace and iron supplement.

## 2018-02-19 NOTE — Patient Instructions (Signed)
1. Does the mirena IUD need to be replaced as it was August 22 2014? 2.She is currently using 2-4 tabs of megace a day to keep bleeding stopped. How long can we continue at that dose? 3. Are there other options for management of bleeding (uterine artery embolization? ) 4. Can the OBGYN biopsy the large lipomatous mass left groin or do I need to send her to a general surgeon? It has increased in size significantly, it is very firm. CT scan showed some areas of necrosis.I am concerned this might be liposarcoma  I am checking a hemoglobin today.

## 2018-02-19 NOTE — Assessment & Plan Note (Signed)
I reviewed her chart extensively today.  Looks like she probably does have clear diagnosis of hypertension.  She will Send me some blood pressure readings in the next week or so.  I will contact her after review those we will likely reconsider restarting antihypertensive medication.  She is previously been on lisinopril/HCTZ.

## 2018-02-19 NOTE — Assessment & Plan Note (Signed)
Ultimately I think she needs a biopsy of this mass.  The CT scan consistent with lipoma but there was some necrosis in the area and the mass is quite large, history of rapid growth.  When she sees GYN oncologist, we can see if they are willing to do a biopsy of this or if we need to send her to general surgery.

## 2018-02-19 NOTE — Assessment & Plan Note (Signed)
FIGO 1 I would tell several questions for her to ask the GYN oncologist.  She has an appointment with him on July 12.   . Does the mirena IUD need to be replaced as it was August 22 2014? 2.She is currently using 2-4 tabs of megace a day to keep bleeding stopped. How long can we continue at that dose? 3. Are there other options for management of bleeding (uterine artery embolization? ) 4. Can the OBGYN biopsy the large lipomatous mass left groin or do I need to send her to a general surgeon? It has increased in size significantly, it is very firm. CT scan showed some areas of necrosis.I am concerned this might be liposarcoma  She will ask questions and let me know.

## 2018-02-19 NOTE — Assessment & Plan Note (Signed)
She is very aware of her weight issues.  We will close the part of her overall ongoing health issues as we move forward.

## 2018-02-20 ENCOUNTER — Encounter: Payer: Self-pay | Admitting: Family Medicine

## 2018-02-20 LAB — CBC
HEMOGLOBIN: 9.2 g/dL — AB (ref 11.1–15.9)
Hematocrit: 33.1 % — ABNORMAL LOW (ref 34.0–46.6)
MCH: 22 pg — ABNORMAL LOW (ref 26.6–33.0)
MCHC: 27.8 g/dL — AB (ref 31.5–35.7)
MCV: 79 fL (ref 79–97)
PLATELETS: 290 10*3/uL (ref 150–450)
RBC: 4.19 x10E6/uL (ref 3.77–5.28)
RDW: 27.6 % — AB (ref 12.3–15.4)
WBC: 9.2 10*3/uL (ref 3.4–10.8)

## 2018-03-03 ENCOUNTER — Inpatient Hospital Stay: Payer: BLUE CROSS/BLUE SHIELD | Attending: Gynecology | Admitting: Gynecology

## 2018-03-03 ENCOUNTER — Encounter: Payer: Self-pay | Admitting: Gynecology

## 2018-03-03 ENCOUNTER — Encounter: Payer: Self-pay | Admitting: Family Medicine

## 2018-03-03 VITALS — BP 176/71 | HR 96 | Temp 98.2°F | Resp 20 | Ht 67.0 in | Wt >= 6400 oz

## 2018-03-03 DIAGNOSIS — C541 Malignant neoplasm of endometrium: Secondary | ICD-10-CM | POA: Diagnosis not present

## 2018-03-03 DIAGNOSIS — Z79818 Long term (current) use of other agents affecting estrogen receptors and estrogen levels: Secondary | ICD-10-CM

## 2018-03-03 NOTE — Progress Notes (Addendum)
Consult Note: Gyn-Onc   Alexandra Morales 47 y.o. female  Chief Complaint  Patient presents with  . Endometrial cancer ([place])     Assessment : Grade 1 endometrial carcinoma. Severe morbid obesity.  Recent onset of severe uterine bleeding and anemia.  Plan: The patient will continue on Megace 40 mg 4 times daily.  We will ask her to be seen in consultation by Dr. [name] for consideration of a robotic hysterectomy which the patient understands is definitive therapy.  A preoperative MRI may be useful but I will defer to Dr. Serita Grit opinion.  Recommend the patient be seen by a surgeon regarding her 20 x 30 cm lipoma of her thigh..  Interval history: Patient returns today having last been seen in 2016.  She has had a Mirena IUD since December 2015 and has done well with that until proximally 3 months ago when she started having vaginal bleeding.  Apparently the bleeding got heavier and the patient was recently admitted.  She was placed on Megace 40 mg 4 times daily and the bleeding has stopped for the past 3 weeks.  Patient has no other constitutional symptoms.  Patient had an ultrasound recently but because of her habitus was inconclusive and endometrial stripe cannot be measured accurately.  HPI: 47 year old white female who was initially seen in consultation at the request of Dr. Delsa Sale regarding management of a newly diagnosed grade 1 endometrial carcinoma. The patient presented with abnormal uterine bleeding and underwent a D&C and insertion of a Mirena IUD on 08/22/2014.    The patient's primary problem is that she is morbidly obese weighing 471 pounds. She denies any GI or GU symptoms has no pelvic pain pressure and is only having some vaginal spotting.  She did well until early 2019 when she began to have vaginal bleeding and a hemoglobin of 4.4 necessitating 4 units of blood transfusion.  In addition to the Mirena IUD she was placed on Megace 40 mg 4 times daily which resulted  in stoppage of the bleeding.   Review of Systems:10 point review of systems is negative except as noted in interval history.   Vitals: Blood pressure (!) 176/71, pulse 96, temperature 98.2 F (36.8 C), temperature source Oral, resp. rate 20, height 5\' 7"  (1.702 m), weight (!) 449 lb (203.7 kg), SpO2 99 %.  Physical Exam: General : The patient is a healthy woman in no acute distress.  Patient refused to have abdominal/pelvic examination due to having loose stools today- [facility], NP per Dr. C-P   Allergies  Allergen Reactions  . Tape Rash    Paper tape is tolerated best, allergic to adhesive tape.    Past Medical History:  Diagnosis Date  . Anemia   . Cancer ([place])   . Endometrial cancer ([place]) 09/01/2014  . History of blood transfusion 04/26/2014   [facility] - 5 Units transfused  . Hypertension   . PCOS (polycystic ovarian syndrome)    tx with metformin    Past Surgical History:  Procedure Laterality Date  . CHOLECYSTECTOMY    . CORNEAL TRANSPLANT Right   . HYSTEROSCOPY W/D&C  08/22/2014   Procedure: DILATATION AND CURETTAGE /HYSTEROSCOPY, Exam Under Anesthesia, Pap Smear, Mirena Insertion;  Surgeon: Lahoma Crocker, MD;  Location: [facility];  Service: Gynecology;;  . Arnetha Courser TOOTH EXTRACTION      Current Outpatient Medications  Medication Sig Dispense Refill  . ferrous sulfate 325 (65 FE) MG tablet Take 325 mg by mouth 3 (three) times daily with  meals.    . megestrol (MEGACE) 40 MG tablet Take 1 tablet (40 mg total) by mouth 2 (two) times daily. Can increase to two tablets twice a day in the event of heavy bleeding 60 tablet 5   No current facility-administered medications for this visit.     Social History   Socioeconomic History  . Marital status: Single    Spouse name: Not on file  . Number of children: Not on file  . Years of education: Not on file  . Highest education level: Not on file  Occupational History  . Not on file  Social Needs  . Financial resource strain: Not  on file  . Food insecurity:    Worry: Not on file    Inability: Not on file  . Transportation needs:    Medical: Not on file    Non-medical: Not on file  Tobacco Use  . Smoking status: Never Smoker  . Smokeless tobacco: Never Used  Substance and Sexual Activity  . Alcohol use: Yes    Alcohol/week: 0.0 oz    Comment: occasionally   . Drug use: No  . Sexual activity: Not Currently    Birth control/protection: IUD  Lifestyle  . Physical activity:    Days per week: Not on file    Minutes per session: Not on file  . Stress: Not on file  Relationships  . Social connections:    Talks on phone: Not on file    Gets together: Not on file    Attends religious service: Not on file    Active member of club or organization: Not on file    Attends meetings of clubs or organizations: Not on file    Relationship status: Not on file  . Intimate partner violence:    Fear of current or ex partner: Not on file    Emotionally abused: Not on file    Physically abused: Not on file    Forced sexual activity: Not on file  Other Topics Concern  . Not on file  Social History Narrative   Lives in [place].    Co-manager at [employer]    Family History  Problem Relation Age of Onset  . Cancer Mother   . Breast cancer Mother       Marti Sleigh, MD 03/03/2018, 12:38 PM

## 2018-03-03 NOTE — Patient Instructions (Signed)
Continue taking the Megace.  We have arranged for you to meet with Dr. Denman George to discuss further treatment options.  Please call for any questions or concerns.

## 2018-03-04 ENCOUNTER — Telehealth: Payer: Self-pay | Admitting: *Deleted

## 2018-03-04 NOTE — Telephone Encounter (Signed)
Fax referral to [facility]

## 2018-03-11 ENCOUNTER — Telehealth: Payer: Self-pay | Admitting: *Deleted

## 2018-03-11 NOTE — Telephone Encounter (Signed)
Received a call from [place] at [facility]. They have tried two attempts to contact the patient. I called and left the patient a message to call the office.She needs to call [facility].

## 2018-03-11 NOTE — Telephone Encounter (Signed)
Returned pt's call regarding someone from here called her.  No answer at her number, left her a VM that "Judson Roch" from [place] is trying to reach her - their number is [phone].

## 2018-03-12 ENCOUNTER — Telehealth: Payer: Self-pay | Admitting: *Deleted

## 2018-03-12 NOTE — Telephone Encounter (Signed)
Called and left the patient a message to call [facility] for an appt. Ask that she call the office back to let us know once she has an appt.

## 2018-03-13 ENCOUNTER — Telehealth: Payer: Self-pay | Admitting: *Deleted

## 2018-03-13 NOTE — Telephone Encounter (Signed)
Attempted to contact the patient to call either our office or [facility].

## 2018-03-20 ENCOUNTER — Inpatient Hospital Stay (HOSPITAL_BASED_OUTPATIENT_CLINIC_OR_DEPARTMENT_OTHER): Payer: BLUE CROSS/BLUE SHIELD | Admitting: Gynecologic Oncology

## 2018-03-20 ENCOUNTER — Encounter: Payer: Self-pay | Admitting: Gynecologic Oncology

## 2018-03-20 VITALS — BP 193/79 | HR 89 | Temp 98.1°F | Resp 20 | Ht 67.0 in | Wt >= 6400 oz

## 2018-03-20 DIAGNOSIS — Z79818 Long term (current) use of other agents affecting estrogen receptors and estrogen levels: Secondary | ICD-10-CM | POA: Diagnosis not present

## 2018-03-20 DIAGNOSIS — C541 Malignant neoplasm of endometrium: Secondary | ICD-10-CM | POA: Diagnosis not present

## 2018-03-20 NOTE — Progress Notes (Signed)
Consult Note: Gyn-Onc   Alexandra Morales 47 y.o. female  Chief Complaint  Patient presents with  . Endometrial cancer ([place])     Assessment : history of grade 1 endometrial carcinoma. Severe morbid obesity.  Recent onset of severe uterine bleeding and anemia and lipoma Bleeding controlled on Megace. Unable to biopsy uterus in the office.  Plan: The patient will continue on Megace 40 mg 4 times daily as this is controlling bleeding symptoms completely and will medically treat an occult cancer. If she begins bleeding on megace, I would recommend (open) MRI to evaluate the uterine myometrial/endometrial features.   If she is taken to the OR for lipoma removal with general surgery, we could perform D&C and replacement of the Mirena at that time. Otherwise, I do not see an indication for intervention with hysterectomy in this patient who continues to not be a good candidate for deep pelvic surgery given her extreme obesity.   Interval history:  Continues to have no bleeding symptoms. Was referred to [facility] regarding lipoma but has not yet established an appointment.   HPI: 47 year old white female who was initially seen in consultation at the request of Dr. Delsa Sale regarding management of a newly diagnosed grade 1 endometrial carcinoma. The patient presented with abnormal uterine bleeding and underwent a D&C and insertion of a Mirena IUD on 08/22/2014.    The patient's primary problem is that she is morbidly obese weighing 471 pounds. She denies any GI or GU symptoms has no pelvic pain pressure and is only having some vaginal spotting.  She did well until early 2019 when she began to have vaginal bleeding and a hemoglobin of 4.4 necessitating 4 units of blood transfusion.  In addition to the Mirena IUD she was placed on Megace 40 mg 4 times daily which resulted in stoppage of the bleeding.  Patient returned in July, 2019 after having last been seen by our office in  2016.  She has had a Mirena IUD since December 2015 and has done well with that until proximally 3 months ago when she started having vaginal bleeding.  Apparently the bleeding got heavier and the patient was recently admitted.  She was placed on Megace 40 mg 4 times daily and the bleeding stopped.   Patient had an ultrasound recently but because of her habitus was inconclusive and endometrial stripe cannot be measured accurately. The uterine size was 12cm.   Review of Systems:10 point review of systems is negative except as noted in interval history.   Vitals: Blood pressure (!) 193/79, pulse 89, temperature 98.1 F (36.7 C), temperature source Oral, resp. rate 20, height 5\' 7"  (1.702 m), weight (!) 449 lb (203.7 kg), SpO2 97 %.  Physical Exam: Gen: in no distress Abd: extreme morbid obesity with large pannus and mons pannus. No masses. Pelvic: Lipoma required additional assistance in order to mobilize it laterally to allow for exam. unable to visualize cervix with speculum. Cervix palpably normal. Unable to perform blind passage of pipelle due to long vaginal length. No bleeding. No palpable IUD strings. Peripheries: peau d'orange changes to lower legs with venous stasis and erythema.    Allergies  Allergen Reactions  . Tape Rash    Paper tape is tolerated best, allergic to adhesive tape.    Past Medical History:  Diagnosis Date  . Anemia   . Cancer ([place])   . Endometrial cancer ([place]) 09/01/2014  . History of blood transfusion 04/26/2014   [facility] - 5 Units  transfused  . Hypertension   . PCOS (polycystic ovarian syndrome)    tx with metformin    Past Surgical History:  Procedure Laterality Date  . CHOLECYSTECTOMY    . CORNEAL TRANSPLANT Right   . HYSTEROSCOPY W/D&C  08/22/2014   Procedure: DILATATION AND CURETTAGE /HYSTEROSCOPY, Exam Under Anesthesia, Pap Smear, Mirena Insertion;  Surgeon: Lahoma Crocker, MD;  Location: [facility];  Service: Gynecology;;  . Arnetha Courser TOOTH EXTRACTION       Current Outpatient Medications  Medication Sig Dispense Refill  . ferrous sulfate 325 (65 FE) MG tablet Take 325 mg by mouth 3 (three) times daily with meals.    . megestrol (MEGACE) 40 MG tablet Take 1 tablet (40 mg total) by mouth 2 (two) times daily. Can increase to two tablets twice a day in the event of heavy bleeding 60 tablet 5   No current facility-administered medications for this visit.     Social History   Socioeconomic History  . Marital status: Single    Spouse name: Not on file  . Number of children: Not on file  . Years of education: Not on file  . Highest education level: Not on file  Occupational History  . Not on file  Social Needs  . Financial resource strain: Not on file  . Food insecurity:    Worry: Not on file    Inability: Not on file  . Transportation needs:    Medical: Not on file    Non-medical: Not on file  Tobacco Use  . Smoking status: Never Smoker  . Smokeless tobacco: Never Used  Substance and Sexual Activity  . Alcohol use: Yes    Alcohol/week: 0.0 oz    Comment: occasionally   . Drug use: No  . Sexual activity: Not Currently    Birth control/protection: IUD  Lifestyle  . Physical activity:    Days per week: Not on file    Minutes per session: Not on file  . Stress: Not on file  Relationships  . Social connections:    Talks on phone: Not on file    Gets together: Not on file    Attends religious service: Not on file    Active member of club or organization: Not on file    Attends meetings of clubs or organizations: Not on file    Relationship status: Not on file  . Intimate partner violence:    Fear of current or ex partner: Not on file    Emotionally abused: Not on file    Physically abused: Not on file    Forced sexual activity: Not on file  Other Topics Concern  . Not on file  Social History Narrative   Lives in [place].    Co-manager at [employer]    Family History  Problem Relation Age of Onset  . Cancer Mother    . Breast cancer Mother       Thereasa Solo, MD 03/20/2018, 11:49 AM

## 2018-03-20 NOTE — Patient Instructions (Signed)
Please follow-up with [facility] regarding the left thigh lipoma. If they are planning surgery, please notify us at [phone].  If you develop new bleeding while on the Megace, please call us at [phone].  Otherwise, continue using the Megace BID as this will continue to control your bleeding symptoms and control the cancer if one is still present.

## 2018-03-27 ENCOUNTER — Telehealth: Payer: Self-pay | Admitting: *Deleted

## 2018-03-27 NOTE — Telephone Encounter (Signed)
Patient called in to say that she had heavy bleeding that lasted for about three hours on 03/26/18.  Patient states " I was also having cramping with the bleeding". The patient states "the bleeding only lasted for about 3 hours and then it stopped, I currently am not having any bleeding today".  Per Joylene John, NP continue to monitor bleeding through out the weekend, please give our office a call on Monday, August 5th to let us know if the bleeding has reoccurred. Patient verbalized understanding.

## 2018-04-13 ENCOUNTER — Telehealth: Payer: Self-pay

## 2018-04-13 ENCOUNTER — Other Ambulatory Visit: Payer: Self-pay | Admitting: Gynecologic Oncology

## 2018-04-13 DIAGNOSIS — F40240 Claustrophobia: Secondary | ICD-10-CM

## 2018-04-13 DIAGNOSIS — C541 Malignant neoplasm of endometrium: Secondary | ICD-10-CM

## 2018-04-13 MED ORDER — LORAZEPAM 1 MG PO TABS: 1.0000 mg | ORAL_TABLET | Freq: Once | ORAL | 0 refills | Status: AC

## 2018-04-13 NOTE — Progress Notes (Signed)
Patient called reporting continued bleeding after taking Megace as prescribed.  Per Dr. Denman George, plan for MRI of the pelvis to evaluate uterine/myometrial features for invasiveness.

## 2018-04-13 NOTE — Telephone Encounter (Signed)
Incoming call from patient, reporting the Megace has not been working well for the past 2 weeks.  She is using 5 pads a day and having low abd cramps.  Per Joylene John NP and Dr Denman George notes- Pt needs MRI scheduled. Pt prefers it scheduled next Wednesday due to she is working and has another appt in afternoon on next Wed to discuss surgery for her leg lipoma so would like MRI to be scheduled in am.  Needs open MRI- at [facility] - pt reports she gets claustrophobic but has been able to tolerate a CT in past.  Per Melissa NP, I let pt know that she is ordering her an Ativan, to take one hour before the MRI and that she must have a driver with her . Pt voiced understanding.  Pt said she has to go back to work but to leave appt info on her VM.  Outgoing call to [facility] - appt made for Thursday Aug 29th arrival at 2:50 pm.  They didn't have anything available on Wednesday.  Per Johna Roles, ok to eat and drink, no metal or jewelry.  Called pt's number - no answer, left her a VM with Appt and instructions given on VM- encouraged her to call our office back to let us know she has gotten appt and left her number to [facility] if needs a different appt.    Outgoing call to patient- no answer- left her another VM to let her know about f/u visit with Dr Denman George on 9-4 at 10:30 am to discuss MRI results.  Reminded her to call our office if needs different appt.

## 2018-04-14 ENCOUNTER — Telehealth: Payer: Self-pay | Admitting: *Deleted

## 2018-04-14 NOTE — Telephone Encounter (Signed)
Patient called and stated that the appts for 8/29 and 9/4 work for her.

## 2018-04-20 ENCOUNTER — Other Ambulatory Visit: Payer: Self-pay | Admitting: Gynecologic Oncology

## 2018-04-20 ENCOUNTER — Telehealth: Payer: Self-pay

## 2018-04-20 DIAGNOSIS — F40240 Claustrophobia: Secondary | ICD-10-CM

## 2018-04-20 MED ORDER — LORAZEPAM 1 MG PO TABS: 1.0000 mg | ORAL_TABLET | Freq: Once | ORAL | 0 refills | Status: AC

## 2018-04-20 NOTE — Telephone Encounter (Signed)
LM for Alexandra Morales that the Aitvan 1 mg tablet was sent into [facility] in [place]. Take 1 hr prior to MRI and Do not drive.

## 2018-04-22 DIAGNOSIS — D1724 Benign lipomatous neoplasm of skin and subcutaneous tissue of left leg: Secondary | ICD-10-CM | POA: Diagnosis not present

## 2018-04-23 ENCOUNTER — Ambulatory Visit
Admission: RE | Admit: 2018-04-23 | Discharge: 2018-04-23 | Disposition: A | Discharge status: Home / Self Care | Payer: BLUE CROSS/BLUE SHIELD | Source: Ambulatory Visit | Attending: Gynecologic Oncology | Admitting: Gynecologic Oncology

## 2018-04-23 DIAGNOSIS — C541 Malignant neoplasm of endometrium: Secondary | ICD-10-CM

## 2018-04-24 ENCOUNTER — Telehealth: Payer: Self-pay

## 2018-04-24 NOTE — Telephone Encounter (Signed)
LM for Alexandra Morales that she needs to keep appointment on 04-29-18 with Dr. Denman George to discuss plan moving forward even though is was not able to have MRI 04-23-18. Requested that she call the office back to let Dr. Denman George know if she is continuing to have vaginal bleeding.

## 2018-04-29 ENCOUNTER — Encounter: Payer: Self-pay | Admitting: Gynecologic Oncology

## 2018-04-29 ENCOUNTER — Inpatient Hospital Stay: Payer: BLUE CROSS/BLUE SHIELD | Attending: Gynecology | Admitting: Gynecologic Oncology

## 2018-04-29 VITALS — BP 168/74 | HR 85 | Temp 98.8°F | Resp 18 | Ht 67.0 in | Wt >= 6400 oz

## 2018-04-29 DIAGNOSIS — C541 Malignant neoplasm of endometrium: Secondary | ICD-10-CM | POA: Diagnosis not present

## 2018-04-29 DIAGNOSIS — Z79818 Long term (current) use of other agents affecting estrogen receptors and estrogen levels: Secondary | ICD-10-CM | POA: Diagnosis not present

## 2018-04-29 DIAGNOSIS — N938 Other specified abnormal uterine and vaginal bleeding: Secondary | ICD-10-CM

## 2018-04-29 MED ORDER — MEGESTROL ACETATE 40 MG PO TABS: 40.0000 mg | ORAL_TABLET | Freq: Two times a day (BID) | ORAL | 6 refills | Status: DC

## 2018-04-29 NOTE — Patient Instructions (Addendum)
Dr Denman George has phoned in a refill for megace for you.  Dr Serita Grit office will coordinate performing a D&C and IUD placement during the procedure with Dr Rosendo Gros.  If you are able to lose weight to <400lbs, Dr Denman George will consider a hysterectomy.    Dilation and Curettage or Vacuum Curettage  Dilation and curettage (D&C) and vacuum curettage are minor procedures. A D&C involves stretching (dilation) the cervix and scraping (curettage) the inside lining of the uterus (endometrium). During a D&C, tissue is gently scraped from the endometrium, starting from the top portion of the uterus down to the lowest part of the uterus (cervix). During a vacuum curettage, the lining and tissue in the uterus are removed with the use of gentle suction. Curettage may be performed to either diagnose or treat a problem. As a diagnostic procedure, curettage is performed to examine tissues from the uterus. A diagnostic curettage may be done if you have:  Irregular bleeding in the uterus.  Bleeding with the development of clots.  Spotting between menstrual periods.  Prolonged menstrual periods or other abnormal bleeding.  Bleeding after menopause.  No menstrual period (amenorrhea).  A change in size and shape of the uterus.  Abnormal endometrial cells discovered during a Pap test.  As a treatment procedure, curettage may be performed for the following reasons:  Removal of an IUD (intrauterine device).  Removal of retained placenta after giving birth.  Abortion.  Miscarriage.  Removal of endometrial polyps.  Removal of uncommon types of noncancerous lumps (fibroids).  Tell a health care provider about:  Any allergies you have, including allergies to prescribed medicine or latex.  All medicines you are taking, including vitamins, herbs, eye drops, creams, and over-the-counter medicines. This is especially important if you take any blood-thinning medicine. Bring a list of all of your medicines to  your appointment.  Any problems you or family members have had with anesthetic medicines.  Any blood disorders you have.  Any surgeries you have had.  Your medical history and any medical conditions you have.  Whether you are pregnant or may be pregnant.  Recent vaginal infections you have had.  Recent menstrual periods, bleeding problems you have had, and what form of birth control (contraception) you use. What are the risks? Generally, this is a safe procedure. However, problems may occur, including:  Infection.  Heavy vaginal bleeding.  Allergic reactions to medicines.  Damage to the cervix or other structures or organs.  Development of scar tissue (adhesions) inside the uterus, which can cause abnormal amounts of menstrual bleeding. This may make it harder to get pregnant in the future.  A hole (perforation) or puncture in the uterine wall. This is rare.  What happens before the procedure? Staying hydrated Follow instructions from your health care provider about hydration, which may include:  Up to 2 hours before the procedure - you may continue to drink clear liquids, such as water, clear fruit juice, black coffee, and plain tea.  Eating and drinking restrictions Follow instructions from your health care provider about eating and drinking, which may include:  8 hours before the procedure - stop eating heavy meals or foods such as meat, fried foods, or fatty foods.  6 hours before the procedure - stop eating light meals or foods, such as toast or cereal.  6 hours before the procedure - stop drinking milk or drinks that contain milk.  2 hours before the procedure - stop drinking clear liquids. If your health care provider told you  to take your medicine(s) on the day of your procedure, take them with only a sip of water.  Medicines  Ask your health care provider about: ? Changing or stopping your regular medicines. This is especially important if you are taking  diabetes medicines or blood thinners. ? Taking medicines such as aspirin and ibuprofen. These medicines can thin your blood. Do not take these medicines before your procedure if your health care provider instructs you not to.  You may be given antibiotic medicine to help prevent infection. General instructions  For 24 hours before your procedure, do not: ? Douche. ? Use tampons. ? Use medicines, creams, or suppositories in the vagina. ? Have sexual intercourse.  You may be given a pregnancy test on the day of the procedure.  Plan to have someone take you home from the hospital or clinic.  You may have a blood or urine sample taken.  If you will be going home right after the procedure, plan to have someone with you for 24 hours. What happens during the procedure?  To reduce your risk of infection: ? Your health care team will wash or sanitize their hands. ? Your skin will be washed with soap.  An IV tube will be inserted into one of your veins.  You will be given one of the following: ? A medicine that numbs the area in and around the cervix (local anesthetic). ? A medicine to make you fall asleep (general anesthetic).  You will lie down on your back, with your feet in foot rests (stirrups).  The size and position of your uterus will be checked.  A lubricated instrument (speculum or Sims retractor) will be inserted into the back side of your vagina. The speculum will be used to hold apart the walls of your vagina so your health care provider can see your cervix.  A tool (tenaculum) will be attached to the lip of the cervix to stabilize it.  Your cervix will be softened and dilated. This may be done by: ? Taking a medicine. ? Having tapered dilators or thin rods (laminaria) or gradual widening instruments (tapered dilators) inserted into your cervix.  A small, sharp, curved instrument (curette) will be used to scrape a small amount of tissue or cells from the endometrium or  cervical canal. In some cases, gentle suction is applied with the curette. The curette will then be removed. The cells will be taken to a lab for testing. The procedure may vary among health care providers and hospitals. What happens after the procedure?  You may have mild cramping, backache, pain, and light bleeding or spotting. You may pass small blood clots from your vagina.  You may have to wear compression stockings. These stockings help to prevent blood clots and reduce swelling in your legs.  Your blood pressure, heart rate, breathing rate, and blood oxygen level will be monitored until the medicines you were given have worn off. Summary  Dilation and curettage (D&C) involves stretching (dilation) the cervix and scraping (curettage) the inside lining of the uterus (endometrium).  After the procedure, you may have mild cramping, backache, pain, and light bleeding or spotting. You may pass small blood clots from your vagina.  Plan to have someone take you home from the hospital or clinic. This information is not intended to replace advice given to you by your health care provider. Make sure you discuss any questions you have with your health care provider. Document Released: 08/12/2005 Document Revised: 04/28/2016 Document Reviewed: 04/28/2016  Elsevier Interactive Patient Education  [employer].  Levonorgestrel intrauterine device (IUD) What is this medicine? LEVONORGESTREL IUD (LEE voe nor jes trel) is a contraceptive (birth control) device. The device is placed inside the uterus by a healthcare professional. It is used to prevent pregnancy. This device can also be used to treat heavy bleeding that occurs during your period. This medicine may be used for other purposes; ask your health care provider or pharmacist if you have questions. COMMON BRAND NAME(S): Minette Headland What should I tell my health care provider before I take this medicine? They need to know  if you have any of these conditions: -abnormal Pap smear -cancer of the breast, uterus, or cervix -diabetes -endometritis -genital or pelvic infection now or in the past -have more than one sexual partner or your partner has more than one partner -heart disease -history of an ectopic or tubal pregnancy -immune system problems -IUD in place -liver disease or tumor -problems with blood clots or take blood-thinners -seizures -use intravenous drugs -uterus of unusual shape -vaginal bleeding that has not been explained -an unusual or allergic reaction to levonorgestrel, other hormones, silicone, or polyethylene, medicines, foods, dyes, or preservatives -pregnant or trying to get pregnant -breast-feeding How should I use this medicine? This device is placed inside the uterus by a health care professional. Talk to your pediatrician regarding the use of this medicine in children. Special care may be needed. Overdosage: If you think you have taken too much of this medicine contact a poison control center or emergency room at once. NOTE: This medicine is only for you. Do not share this medicine with others. What if I miss a dose? This does not apply. Depending on the brand of device you have inserted, the device will need to be replaced every 3 to 5 years if you wish to continue using this type of birth control. What may interact with this medicine? Do not take this medicine with any of the following medications: -amprenavir -bosentan -fosamprenavir This medicine may also interact with the following medications: -aprepitant -armodafinil -barbiturate medicines for inducing sleep or treating seizures -bexarotene -boceprevir -griseofulvin -medicines to treat seizures like carbamazepine, ethotoin, felbamate, oxcarbazepine, phenytoin, topiramate -modafinil -pioglitazone -rifabutin -rifampin -rifapentine -some medicines to treat HIV infection like atazanavir, efavirenz, indinavir,  lopinavir, nelfinavir, tipranavir, ritonavir -St. John's wort -warfarin This list may not describe all possible interactions. Give your health care provider a list of all the medicines, herbs, non-prescription drugs, or dietary supplements you use. Also tell them if you smoke, drink alcohol, or use illegal drugs. Some items may interact with your medicine. What should I watch for while using this medicine? Visit your doctor or health care professional for regular check ups. See your doctor if you or your partner has sexual contact with others, becomes HIV positive, or gets a sexual transmitted disease. This product does not protect you against HIV infection (AIDS) or other sexually transmitted diseases. You can check the placement of the IUD yourself by reaching up to the top of your vagina with clean fingers to feel the threads. Do not pull on the threads. It is a good habit to check placement after each menstrual period. Call your doctor right away if you feel more of the IUD than just the threads or if you cannot feel the threads at all. The IUD may come out by itself. You may become pregnant if the device comes out. If you notice that the IUD has come out use  a backup birth control method like condoms and call your health care provider. Using tampons will not change the position of the IUD and are okay to use during your period. This IUD can be safely scanned with magnetic resonance imaging (MRI) only under specific conditions. Before you have an MRI, tell your healthcare provider that you have an IUD in place, and which type of IUD you have in place. What side effects may I notice from receiving this medicine? Side effects that you should report to your doctor or health care professional as soon as possible: -allergic reactions like skin rash, itching or hives, swelling of the face, lips, or tongue -fever, flu-like symptoms -genital sores -high blood pressure -no menstrual period for 6 weeks  during use -pain, swelling, warmth in the leg -pelvic pain or tenderness -severe or sudden headache -signs of pregnancy -stomach cramping -sudden shortness of breath -trouble with balance, talking, or walking -unusual vaginal bleeding, discharge -yellowing of the eyes or skin Side effects that usually do not require medical attention (report to your doctor or health care professional if they continue or are bothersome): -acne -breast pain -change in sex drive or performance -changes in weight -cramping, dizziness, or faintness while the device is being inserted -headache -irregular menstrual bleeding within first 3 to 6 months of use -nausea This list may not describe all possible side effects. Call your doctor for medical advice about side effects. You may report side effects to FDA at 1-800-FDA-1088. Where should I keep my medicine? This does not apply. NOTE: This sheet is a summary. It may not cover all possible information. If you have questions about this medicine, talk to your doctor, pharmacist, or health care provider.  2018 Elsevier/Gold Standard (2016-05-24 14:14:56)

## 2018-04-29 NOTE — Progress Notes (Signed)
Follow-up Note: Gyn-Onc   Alexandra Morales 47 y.o. female  Chief Complaint  Patient presents with  . Endometrial cancer ([place])     Assessment : history of grade 1 endometrial carcinoma. Severe morbid obesity.  Persistent uterine bleeding (without anemia) and lipoma. Not a candidate for hysterectomy due to extreme abdominal adiposity. Would consider hysterectomy if patient were to be <400lbs. Bleeding not well controlled on Megace. Unable to biopsy uterus in the office. Patient declined MRI (including open MRI)  Plan: The patient will continue on Megace 40 mg 4 times daily. Recommend D&C with placement of Mirena IUD (as this controlled her symptoms in 2015). We will plan to time this procedure with the planned excision of left thigh lipoma.  Alexandra Morales will see Dr Rosendo Gros for this later this month and discuss surgical plan. We will coordinate with his office the timing of the procedure (our D&C and Mirena will be an approximately 30 minute procedure and can be performed at [facility] if this is his preference.    I spent time discussing with Alexandra Morales and her mother why her body habitus (in particular, the distribution of her body fat) makes her a poor candidate for a deep pelvic surgery such as hysterectomy. I explained that there would not be safe visualization of her deep pelvic structures. I explained that this might be possible if she were to have more significant weight loss (< 400lbs total), however, she has gained 20lbs since July, 2019. Alexandra Morales expressed frustration that I can't " just do the hysterectomy". She feels that the bleeding symptoms are a nuisance.   Interval history:  Began experiencing bleeding again in July, 2019. This stopped in August, 2019.  She had an MRI ordered to evaluate the uterine lining, however, despite ordering an open MRI and prescribing Ativan, she did not consent to the scan and declined it due to anxiety/claustraphobia. She is frustrated with the  bleeding which she finds "tiresome".  She doesn't understand why she can't "just have a hysterectomy".   HPI: 47 year old white female who was initially seen in consultation at the request of Dr. Delsa Sale regarding management of a newly diagnosed grade 1 endometrial carcinoma. The patient presented with abnormal uterine bleeding and underwent a D&C and insertion of a Mirena IUD on 08/22/2014.    The patient's primary problem is that she is morbidly obese weighing 471 pounds. She denies any GI or GU symptoms has no pelvic pain pressure and is only having some vaginal spotting.  She did well until early 2019 when she began to have vaginal bleeding and a hemoglobin of 4.4 necessitating 4 units of blood transfusion.  In addition to the Mirena IUD she was placed on Megace 40 mg 4 times daily which resulted in stoppage of the bleeding.  Patient returned in July, 2019 after having last been seen by our office in 2016.  She has had a Mirena IUD since December 2015 and has done well with that until proximally 3 months ago when she started having vaginal bleeding.  Apparently the bleeding got heavier and the patient was recently admitted.  She was placed on Megace 40 mg 4 times daily and the bleeding stopped.   Patient had an ultrasound recently but because of her habitus was inconclusive and endometrial stripe cannot be measured accurately. The uterine size was 12cm.   Review of Systems:10 point review of systems is negative except as noted in interval history.   Vitals: Blood pressure (!) 168/74, pulse 85,  temperature 98.8 F (37.1 C), temperature source Oral, resp. rate 18, height 5\' 7"  (1.702 m), weight (!) 473 lb 12.8 oz (214.9 kg), SpO2 93 %.  Physical Exam: Gen: in no distress Abd: extreme morbid obesity with large pannus and mons pannus. No masses. Pelvic: deferred Peripheries: peau d'orange changes to lower legs with venous stasis and erythema.    Allergies  Allergen Reactions  . Tape  Rash    Paper tape is tolerated best, allergic to adhesive tape.    Past Medical History:  Diagnosis Date  . Anemia   . Cancer ([place])   . Endometrial cancer ([place]) 09/01/2014  . History of blood transfusion 04/26/2014   [facility] - 5 Units transfused  . Hypertension   . PCOS (polycystic ovarian syndrome)    tx with metformin    Past Surgical History:  Procedure Laterality Date  . CHOLECYSTECTOMY    . CORNEAL TRANSPLANT Right   . HYSTEROSCOPY W/D&C  08/22/2014   Procedure: DILATATION AND CURETTAGE /HYSTEROSCOPY, Exam Under Anesthesia, Pap Smear, Mirena Insertion;  Surgeon: Lahoma Crocker, MD;  Location: [facility];  Service: Gynecology;;  . Arnetha Courser TOOTH EXTRACTION      Current Outpatient Medications  Medication Sig Dispense Refill  . megestrol (MEGACE) 40 MG tablet Take 1 tablet (40 mg total) by mouth 2 (two) times daily. Can increase to two tablets twice a day in the event of heavy bleeding 60 tablet 6  . ferrous sulfate 325 (65 FE) MG tablet Take 325 mg by mouth 3 (three) times daily with meals.     No current facility-administered medications for this visit.     Social History   Socioeconomic History  . Marital status: Single    Spouse name: Not on file  . Number of children: Not on file  . Years of education: Not on file  . Highest education level: Not on file  Occupational History  . Not on file  Social Needs  . Financial resource strain: Not on file  . Food insecurity:    Worry: Not on file    Inability: Not on file  . Transportation needs:    Medical: Not on file    Non-medical: Not on file  Tobacco Use  . Smoking status: Never Smoker  . Smokeless tobacco: Never Used  Substance and Sexual Activity  . Alcohol use: Yes    Alcohol/week: 0.0 standard drinks    Comment: occasionally   . Drug use: No  . Sexual activity: Not Currently    Birth control/protection: IUD  Lifestyle  . Physical activity:    Days per week: Not on file    Minutes per session: Not on file   . Stress: Not on file  Relationships  . Social connections:    Talks on phone: Not on file    Gets together: Not on file    Attends religious service: Not on file    Active member of club or organization: Not on file    Attends meetings of clubs or organizations: Not on file    Relationship status: Not on file  . Intimate partner violence:    Fear of current or ex partner: Not on file    Emotionally abused: Not on file    Physically abused: Not on file    Forced sexual activity: Not on file  Other Topics Concern  . Not on file  Social History Narrative   Lives in [place].    Co-manager at [employer] History  Problem Relation Age of Onset  . Cancer Mother   . Breast cancer Mother       Thereasa Solo, MD 04/29/2018, 12:26 PM

## 2018-05-01 ENCOUNTER — Other Ambulatory Visit (HOSPITAL_COMMUNITY): Payer: Self-pay | Admitting: General Surgery

## 2018-05-01 ENCOUNTER — Other Ambulatory Visit: Payer: Self-pay | Admitting: General Surgery

## 2018-05-01 DIAGNOSIS — R2242 Localized swelling, mass and lump, left lower limb: Secondary | ICD-10-CM

## 2018-05-05 ENCOUNTER — Encounter: Payer: Self-pay | Admitting: Family Medicine

## 2018-05-05 ENCOUNTER — Ambulatory Visit (INDEPENDENT_AMBULATORY_CARE_PROVIDER_SITE_OTHER): Payer: BLUE CROSS/BLUE SHIELD | Admitting: Family Medicine

## 2018-05-05 ENCOUNTER — Other Ambulatory Visit: Payer: Self-pay

## 2018-05-05 VITALS — BP 190/100 | HR 98 | Temp 98.5°F | Ht 67.0 in | Wt >= 6400 oz

## 2018-05-05 DIAGNOSIS — N939 Abnormal uterine and vaginal bleeding, unspecified: Secondary | ICD-10-CM | POA: Diagnosis not present

## 2018-05-05 DIAGNOSIS — R454 Irritability and anger: Secondary | ICD-10-CM | POA: Diagnosis not present

## 2018-05-05 DIAGNOSIS — I872 Venous insufficiency (chronic) (peripheral): Secondary | ICD-10-CM

## 2018-05-05 DIAGNOSIS — Z23 Encounter for immunization: Secondary | ICD-10-CM

## 2018-05-05 LAB — POCT HEMOGLOBIN: Hemoglobin: 8.9 g/dL — AB (ref 12.2–16.2)

## 2018-05-05 MED ORDER — TRIAMTERENE-HCTZ 50-25 MG PO CAPS: 1.0000 | ORAL_CAPSULE | ORAL | 1 refills | Status: DC

## 2018-05-05 MED ORDER — FLUOXETINE HCL 20 MG PO TABS: 20.0000 mg | ORAL_TABLET | Freq: Every day | ORAL | 1 refills | Status: DC

## 2018-05-05 NOTE — Progress Notes (Deleted)
tdap Vaginal blee

## 2018-05-06 ENCOUNTER — Telehealth: Payer: Self-pay | Admitting: Gynecologic Oncology

## 2018-05-06 DIAGNOSIS — R454 Irritability and anger: Secondary | ICD-10-CM | POA: Insufficient documentation

## 2018-05-06 NOTE — Telephone Encounter (Signed)
Called [facility] to begin the process of arranging for a joint surgical procedure with Dr. Rosendo Gros and Dr. Denman George.  Staff member stating that her surgery has not been scheduled and that she has a follow up scheduled with Dr. Rosendo Gros for Sept 18.  Unable to begin making arrangements at this time.  Will continue to follow and will follow up after her appt on Sept 18.

## 2018-05-06 NOTE — Assessment & Plan Note (Signed)
Long discussion about her options.  Her current fatigue does not seem to be related to her hemoglobin.  I think it is probably related to her stress from all the evaluation and all the planning for treatment options.  She would like to have hysterectomy the best possible at this time.  50% of our 25 and office visit in counseling education regarding treatment options ordination of care.  She has an appointment next week general surgeon who will determine whether or not he is going to do a biopsy and then excision of the mass on her side.  At the time of the excision, her GYN oncologist plans to do a D&C placement.  High-dose Megace.

## 2018-05-06 NOTE — Assessment & Plan Note (Signed)
Significant venous insufficiency which is complicated by lower extremity edema.  We will start her on diuretic.  I think some of this in the lower extremity edema is probably related to her high-dose Megace treatment which hopefully will be short-term.  Follow-up 1 month.

## 2018-05-06 NOTE — Assessment & Plan Note (Signed)
Start fluoxetine 20 mg daily.  Follow-up 1 month.  Likely will have to increase dose

## 2018-05-06 NOTE — Progress Notes (Signed)
    CHIEF COMPLAINT / HPI: Has been feeling quite fatigued.  Very well.  She is undergoing a lot of evaluation work-up for her endometrial cancer, abnormal uterine bleeding, left thigh mass.  She will be quite frustrating for her.  Vaginal bleeding for about 3 weeks until it stopped 3 or 4 days ago.  She is also having increased lower extremity edema which she thinks may be related to Megace she seems to be retaining more fluid.  Has noted a small ulcer on the right lower extremity mostly to look at that.  REVIEW OF SYSTEMS: See HPI.  Denies chest pain.  PERTINENT  PMH / PSH: I have reviewed the patient's medications, allergies, past medical and surgical history, smoking status and updated in the EMR as appropriate.   OBJECTIVE:  Vital signs reviewed. GENERAL: Well-developed, well-nourished, no acute distress. CARDIOVASCULAR: Regular rate and rhythm no murmur gallop or rub LUNGS: Clear to auscultation bilaterally, no rales or wheeze. ABDOMEN: Soft positive bowel sounds NEURO: No gross focal neurological deficits. MSK: Movement of extremity x 4. EXTREMITY: 1+ pitting edema shin.   SKIN: Small 1/2 cm stage I ulceration on the right anterior ankle is weeping some serous fluid Psychiatric: Alert and oriented x4.  Affect is interactive.  Speech is normal fluency and content.  Recent remote memory is intact.  Judgment is intact..    ASSESSMENT / PLAN:  Abnormal uterine bleeding (AUB) Long discussion about her options.  Her current fatigue does not seem to be related to her hemoglobin.  I think it is probably related to her stress from all the evaluation and all the planning for treatment options.  She would like to have hysterectomy the best possible at this time.  50% of our 25 and office visit in counseling education regarding treatment options ordination of care.  She has an appointment next week general surgeon who will determine whether or not he is going to do a biopsy and then excision  of the mass on her side.  At the time of the excision, her GYN oncologist plans to do a D&C placement.  High-dose Megace.  Irritability Start fluoxetine 20 mg daily.  Follow-up 1 month.  Likely will have to increase dose  Venous insufficiency Significant venous insufficiency which is complicated by lower extremity edema.  We will start her on diuretic.  I think some of this in the lower extremity edema is probably related to her high-dose Megace treatment which hopefully will be short-term.  Follow-up 1 month.

## 2018-05-08 ENCOUNTER — Other Ambulatory Visit (HOSPITAL_COMMUNITY): Payer: Self-pay | Admitting: General Surgery

## 2018-05-08 DIAGNOSIS — R2242 Localized swelling, mass and lump, left lower limb: Secondary | ICD-10-CM

## 2018-05-11 ENCOUNTER — Other Ambulatory Visit: Payer: Self-pay | Admitting: Radiology

## 2018-05-11 ENCOUNTER — Telehealth: Payer: Self-pay

## 2018-05-11 NOTE — Telephone Encounter (Signed)
Fax from pharmacy states Dyazide on backorder and asks for alternative to be sent. Danley Danker, RN [facility] RN)

## 2018-05-12 MED ORDER — INDAPAMIDE 2.5 MG PO TABS: 2.5000 mg | ORAL_TABLET | Freq: Every day | ORAL | 3 refills | Status: DC

## 2018-05-12 NOTE — Telephone Encounter (Signed)
Dear Dema Severin Team [facility] please let her know I have sent in a different one THANKS! [name]

## 2018-05-12 NOTE — Telephone Encounter (Signed)
Patient informed.  Fleeger, Jessica Dawn, CMA  

## 2018-05-12 NOTE — Telephone Encounter (Signed)
LVM for pt to call the office. If pt calls, please let her know a different medication has been called in. Ottis Stain, CMA

## 2018-05-13 ENCOUNTER — Ambulatory Visit (HOSPITAL_COMMUNITY)
Admission: RE | Admit: 2018-05-13 | Discharge: 2018-05-13 | Disposition: A | Discharge status: Home / Self Care | Payer: BLUE CROSS/BLUE SHIELD | Source: Ambulatory Visit | Attending: Family Medicine | Admitting: Family Medicine

## 2018-05-13 ENCOUNTER — Ambulatory Visit (HOSPITAL_COMMUNITY)
Admission: RE | Admit: 2018-05-13 | Discharge: 2018-05-13 | Disposition: A | Discharge status: Home / Self Care | Payer: BLUE CROSS/BLUE SHIELD | Source: Ambulatory Visit | Attending: General Surgery | Admitting: General Surgery

## 2018-05-13 ENCOUNTER — Encounter (HOSPITAL_COMMUNITY): Payer: Self-pay

## 2018-05-13 DIAGNOSIS — E282 Polycystic ovarian syndrome: Secondary | ICD-10-CM | POA: Insufficient documentation

## 2018-05-13 DIAGNOSIS — D1779 Benign lipomatous neoplasm of other sites: Secondary | ICD-10-CM | POA: Diagnosis not present

## 2018-05-13 DIAGNOSIS — Z9049 Acquired absence of other specified parts of digestive tract: Secondary | ICD-10-CM | POA: Insufficient documentation

## 2018-05-13 DIAGNOSIS — I10 Essential (primary) hypertension: Secondary | ICD-10-CM | POA: Diagnosis not present

## 2018-05-13 DIAGNOSIS — Z8542 Personal history of malignant neoplasm of other parts of uterus: Secondary | ICD-10-CM | POA: Insufficient documentation

## 2018-05-13 DIAGNOSIS — Z888 Allergy status to other drugs, medicaments and biological substances status: Secondary | ICD-10-CM | POA: Diagnosis not present

## 2018-05-13 DIAGNOSIS — R2242 Localized swelling, mass and lump, left lower limb: Secondary | ICD-10-CM | POA: Diagnosis not present

## 2018-05-13 DIAGNOSIS — D492 Neoplasm of unspecified behavior of bone, soft tissue, and skin: Secondary | ICD-10-CM | POA: Diagnosis not present

## 2018-05-13 DIAGNOSIS — Z79899 Other long term (current) drug therapy: Secondary | ICD-10-CM | POA: Diagnosis not present

## 2018-05-13 DIAGNOSIS — Z6841 Body Mass Index (BMI) 40.0 and over, adult: Secondary | ICD-10-CM | POA: Insufficient documentation

## 2018-05-13 LAB — CBC WITH DIFFERENTIAL/PLATELET
BASOS ABS: 0 10*3/uL (ref 0.0–0.1)
BASOS PCT: 1 %
EOS ABS: 0.1 10*3/uL (ref 0.0–0.7)
EOS PCT: 1 %
HCT: 33.1 % — ABNORMAL LOW (ref 36.0–46.0)
Hemoglobin: 9.3 g/dL — ABNORMAL LOW (ref 12.0–15.0)
Lymphocytes Relative: 19 %
Lymphs Abs: 1.3 10*3/uL (ref 0.7–4.0)
MCH: 20.8 pg — ABNORMAL LOW (ref 26.0–34.0)
MCHC: 28.1 g/dL — ABNORMAL LOW (ref 30.0–36.0)
MCV: 74 fL — ABNORMAL LOW (ref 78.0–100.0)
Monocytes Absolute: 0.4 10*3/uL (ref 0.1–1.0)
Monocytes Relative: 6 %
Neutro Abs: 5.2 10*3/uL (ref 1.7–7.7)
Neutrophils Relative %: 73 %
PLATELETS: 263 10*3/uL (ref 150–400)
RBC: 4.47 MIL/uL (ref 3.87–5.11)
RDW: 17.5 % — AB (ref 11.5–15.5)
WBC: 7.1 10*3/uL (ref 4.0–10.5)

## 2018-05-13 LAB — BASIC METABOLIC PANEL
Anion gap: 9 (ref 5–15)
BUN: 17 mg/dL (ref 6–20)
CO2: 25 mmol/L (ref 22–32)
CREATININE: 1.15 mg/dL — AB (ref 0.44–1.00)
Calcium: 8.8 mg/dL — ABNORMAL LOW (ref 8.9–10.3)
Chloride: 107 mmol/L (ref 98–111)
GFR calc Af Amer: >60 mL/min (ref >60)
GFR, EST NON AFRICAN AMERICAN: 56 mL/min — AB (ref >60)
Glucose, Bld: 85 mg/dL (ref 70–99)
Potassium: 4.3 mmol/L (ref 3.5–5.1)
SODIUM: 141 mmol/L (ref 135–145)

## 2018-05-13 LAB — PROTIME-INR
INR: 1.02
Prothrombin Time: 13.3 seconds (ref 11.4–15.2)

## 2018-05-13 MED ORDER — SODIUM CHLORIDE 0.9 % IV SOLN
INTRAVENOUS | Status: DC
  Administered 2018-05-13: 11:00:00 via INTRAVENOUS

## 2018-05-13 MED ORDER — FENTANYL CITRATE (PF) 100 MCG/2ML IJ SOLN
INTRAMUSCULAR | Status: AC
  Filled 2018-05-13: qty 2

## 2018-05-13 MED ORDER — MIDAZOLAM HCL 2 MG/2ML IJ SOLN
INTRAMUSCULAR | Status: AC
  Filled 2018-05-13: qty 2

## 2018-05-13 MED ORDER — ACETAMINOPHEN 325 MG PO TABS
ORAL_TABLET | ORAL | Status: AC
  Filled 2018-05-13: qty 2

## 2018-05-13 MED ORDER — LIDOCAINE HCL 1 % IJ SOLN
INTRAMUSCULAR | Status: AC
  Filled 2018-05-13: qty 20

## 2018-05-13 MED ORDER — ACETAMINOPHEN 325 MG PO TABS
650.0000 mg | ORAL_TABLET | Freq: Once | ORAL | Status: AC
  Administered 2018-05-13: 650 mg via ORAL

## 2018-05-13 NOTE — Procedures (Signed)
US guided core biopsies of the massive fatty lesion in left thigh.  6 cores obtained.  Minimal blood loss and no immediate complication.

## 2018-05-13 NOTE — Discharge Instructions (Signed)

## 2018-05-13 NOTE — H&P (Signed)
Chief Complaint: Patient was seen in consultation today for image guided aspiration/biopsy of left thigh fatty mass   Referring Physician(s): Ramirez,Armando  Supervising Physician: Markus Daft  Patient Status: [facility] - Out-pt  History of Present Illness: Alexandra Morales is a 47 y.o. female with history of grade 1 endometrial carcinoma, severe morbid obesity as well as enlarging left thigh fatty mass.  CT scan from 02/08/2018 revealed a fat attenuating lesion in the medial aspect of the left upper leg measuring 30 cm in greatest diameter and most consistent with lipoma.  She presents today for image guided aspiration/biopsy of area for further evaluation.  Past Medical History:  Diagnosis Date  . Anemia   . Cancer ([place])   . Endometrial cancer ([place]) 09/01/2014  . History of blood transfusion 04/26/2014   [facility] - 5 Units transfused  . Hypertension   . PCOS (polycystic ovarian syndrome)    tx with metformin    Past Surgical History:  Procedure Laterality Date  . CHOLECYSTECTOMY    . CORNEAL TRANSPLANT Right   . HYSTEROSCOPY W/D&C  08/22/2014   Procedure: DILATATION AND CURETTAGE /HYSTEROSCOPY, Exam Under Anesthesia, Pap Smear, Mirena Insertion;  Surgeon: Lahoma Crocker, MD;  Location: [facility];  Service: Gynecology;;  . Arnetha Courser TOOTH EXTRACTION      Allergies: Tape  Medications: Prior to Admission medications   Medication Sig Start Date End Date Taking? Authorizing Provider  ferrous sulfate 325 (65 FE) MG tablet Take 325 mg by mouth 3 (three) times daily with meals.   Yes [provider]  megestrol (MEGACE) 40 MG tablet Take 1 tablet (40 mg total) by mouth 2 (two) times daily. Can increase to two tablets twice a day in the event of heavy bleeding 04/29/18  Yes [name], MD  FLUoxetine (PROZAC) 20 MG tablet Take 1 tablet (20 mg total) by mouth daily. 05/05/18   [name], MD  indapamide (LOZOL) 2.5 MG tablet Take 1 tablet (2.5 mg total) by mouth daily. 05/12/18   [name], MD     Family History  Problem Relation Age of Onset  . Cancer Mother   . Breast cancer Mother     Social History   Socioeconomic History  . Marital status: Single    Spouse name: Not on file  . Number of children: Not on file  . Years of education: Not on file  . Highest education level: Not on file  Occupational History  . Not on file  Social Needs  . Financial resource strain: Not on file  . Food insecurity:    Worry: Not on file    Inability: Not on file  . Transportation needs:    Medical: Not on file    Non-medical: Not on file  Tobacco Use  . Smoking status: Never Smoker  . Smokeless tobacco: Never Used  Substance and Sexual Activity  . Alcohol use: Yes    Alcohol/week: 0.0 standard drinks    Comment: occasionally   . Drug use: No  . Sexual activity: Not Currently    Birth control/protection: IUD  Lifestyle  . Physical activity:    Days per week: Not on file    Minutes per session: Not on file  . Stress: Not on file  Relationships  . Social connections:    Talks on phone: Not on file    Gets together: Not on file    Attends religious service: Not on file    Active member of club or organization:  Not on file    Attends meetings of clubs or organizations: Not on file    Relationship status: Not on file  Other Topics Concern  . Not on file  Social History Narrative   Lives in [place].    Co-manager at [employer]      Review of Systems :denies fever,HA,CP, dyspnea, cough, abd/back pain,N/V; she has had some recent uterine bleeding  Vital Signs: BP (!) 207/86 (BP Location: Left Arm)   Pulse 97   Temp 98.6 F (37 C) (Oral)   Resp 18   Ht 5\' 7"  (1.702 m)   Wt (!) 472 lb (214.1 kg)   SpO2 92%   BMI 73.93 kg/m   Physical Exam awake/alert; chest- CTA bilat ant; heart- RRR; abd- morbidly obese ,few BS,NT; LE's with edema/erythema/small amt skin breakdown/ulceration/venous stasis changes; left upper/inner thigh ST mass/?lipoma  ,NT  Imaging: No results found.  Labs:  CBC: Recent Labs    02/06/18 1421 02/07/18 0508 02/08/18 0219 02/19/18 1104 05/05/18 0945  WBC 9.1 6.5 8.3 9.2  --   HGB 4.4* 5.6* 7.0* 9.2* 8.9*  HCT 19.0* 21.8* 25.5* 33.1*  --   PLT 281 258 251 290  --     COAGS: Recent Labs    02/07/18 0508 05/13/18 1123  INR 1.22 1.02  APTT 32  --     BMP: Recent Labs    02/06/18 1421 02/07/18 0508 02/08/18 0219 05/13/18 1123  NA 143 141 138 141  K 3.8 3.4* 3.6 4.3  CL 109 108 109 107  CO2 24 26 23 25   GLUCOSE 104* 90 110* 85  BUN 12 11 12 17   CALCIUM 9.1 8.6* 8.2* 8.8*  CREATININE 1.23* 1.03* 1.00 1.15*  GFRNONAA 52* >60 >60 56*  GFRAA >60 >60 >60 >60    LIVER FUNCTION TESTS: Recent Labs    02/05/18 1536 02/06/18 1421  BILITOT 0.3 0.4  AST 10 9*  ALT 7 10*  ALKPHOS 49 45  PROT 6.4 6.6  ALBUMIN 3.9 3.3*    TUMOR MARKERS: No results for input(s): AFPTM, CEA, CA199, CHROMGRNA in the last 8760 hours.  Assessment and Plan:  47 y.o. female with history of grade 1 endometrial carcinoma, severe morbid obesity as well as enlarging left thigh fatty mass.  CT scan from 02/08/2018 revealed a fat attenuating lesion in the medial aspect of the left upper leg measuring 30 cm in greatest diameter and most consistent with lipoma.  She presents today for image guided aspiration/biopsy of area for further evaluation.Risks and benefits discussed with the patient including, but not limited to bleeding, infection, damage to adjacent structures or low yield requiring additional tests.  All of the patient's questions were answered, patient is agreeable to proceed. Consent signed and in chart.  LABS PENDING   Thank you for this interesting consult.  I greatly enjoyed meeting Mykelti Savannah and look forward to participating in their care.  A copy of this report was sent to the requesting provider on this date.  Electronically Signed: D. Rowe Robert, PA-C 05/13/2018, 12:04 PM   I spent  a total of  20 minutes   in face to face in clinical consultation, greater than 50% of which was counseling/coordinating care for image guided aspiration/biopsy of left thigh mass

## 2018-05-14 ENCOUNTER — Telehealth: Payer: Self-pay | Admitting: Oncology

## 2018-05-14 NOTE — Telephone Encounter (Signed)
[place] to see if patient has surgery scheduled with Dr. Rosendo Gros.  They said she had an appointment on 05/13/18 but it was canceled because she needed a biopsy first.  She will need to see Dr. Rosendo Gros before the surgery is scheduled and that appointment has not been made yet.

## 2018-05-18 ENCOUNTER — Other Ambulatory Visit: Payer: Self-pay | Admitting: Obstetrics and Gynecology

## 2018-05-18 DIAGNOSIS — C541 Malignant neoplasm of endometrium: Secondary | ICD-10-CM

## 2018-05-20 ENCOUNTER — Telehealth: Payer: Self-pay | Admitting: Oncology

## 2018-05-20 NOTE — Telephone Encounter (Signed)
Called [facility] and patient has an appointment scheduled with Dr. Rosendo Gros on 05/28/18 at 10 am.

## 2018-05-21 ENCOUNTER — Telehealth: Payer: Self-pay | Admitting: Oncology

## 2018-05-21 NOTE — Telephone Encounter (Signed)
Called Alexandra Morales and advised her to call back when she has a surgery date.  She verbalized agreement and then asked if her Megace has been refilled.  Advised her that Dr. Denman George filled it on 04/29/18 with 6 refills.

## 2018-05-28 DIAGNOSIS — D1724 Benign lipomatous neoplasm of skin and subcutaneous tissue of left leg: Secondary | ICD-10-CM | POA: Diagnosis not present

## 2018-05-29 ENCOUNTER — Telehealth: Payer: Self-pay | Admitting: Oncology

## 2018-05-29 NOTE — Telephone Encounter (Signed)
Called Alexandra Morales about her appointment with Dr. Rosendo Gros yesterday.  She said he is planning on surgery and will coordinate with Dr. Denman George.  Called [facility] and they said they are waiting for the precert for surgery. They will call back when they are ready to schedule.

## 2018-06-03 ENCOUNTER — Ambulatory Visit: Payer: BLUE CROSS/BLUE SHIELD | Admitting: Family Medicine

## 2018-06-08 ENCOUNTER — Ambulatory Visit: Payer: Self-pay | Admitting: General Surgery

## 2018-06-10 ENCOUNTER — Ambulatory Visit: Payer: BLUE CROSS/BLUE SHIELD | Admitting: Family Medicine

## 2018-06-23 ENCOUNTER — Telehealth: Payer: Self-pay | Admitting: Family Medicine

## 2018-06-23 NOTE — Telephone Encounter (Signed)
Attempted to contact pt. Left voicemail reminding them of appt tomorrow, 06/24/18. -[place]

## 2018-06-24 ENCOUNTER — Ambulatory Visit (INDEPENDENT_AMBULATORY_CARE_PROVIDER_SITE_OTHER): Payer: BLUE CROSS/BLUE SHIELD | Admitting: Family Medicine

## 2018-06-24 ENCOUNTER — Encounter: Payer: Self-pay | Admitting: Family Medicine

## 2018-06-24 ENCOUNTER — Other Ambulatory Visit: Payer: Self-pay

## 2018-06-24 VITALS — BP 178/100 | HR 83 | Temp 98.8°F | Ht 67.0 in | Wt >= 6400 oz

## 2018-06-24 DIAGNOSIS — I10 Essential (primary) hypertension: Secondary | ICD-10-CM

## 2018-06-24 DIAGNOSIS — R2242 Localized swelling, mass and lump, left lower limb: Secondary | ICD-10-CM

## 2018-06-24 DIAGNOSIS — N939 Abnormal uterine and vaginal bleeding, unspecified: Secondary | ICD-10-CM | POA: Diagnosis not present

## 2018-06-24 DIAGNOSIS — R454 Irritability and anger: Secondary | ICD-10-CM | POA: Diagnosis not present

## 2018-06-24 MED ORDER — INDAPAMIDE 2.5 MG PO TABS: ORAL_TABLET | ORAL | 3 refills | Status: DC

## 2018-06-24 MED ORDER — FLUOXETINE HCL 40 MG PO CAPS: 40.0000 mg | ORAL_CAPSULE | Freq: Every day | ORAL | 3 refills | Status: DC

## 2018-06-24 NOTE — Assessment & Plan Note (Signed)
Elevated blood pressure today.  Part of this is certainly related to her anxiety related to whitecoat hypertension.  We had started indapamide for some lower extremity edema.  We will go ahead and increase the dose of that and follow her up after her upcoming surgery.  They will be checking blood work next week for her surgery so we will not get any today.  I will follow her up after her surgical procedure.

## 2018-06-24 NOTE — Assessment & Plan Note (Signed)
They plan replacement of progesterone IUD during her upcoming surgical procedure.  Hopefully she will be able to discontinue the oral Megace at that time.  Potentially, she will be eligible for hysterectomy when she gets below 400 pounds.

## 2018-06-24 NOTE — Assessment & Plan Note (Signed)
If good improvement noted by both patient and her family and her irritability after we started fluoxetine.  She still having some problems sleeping at night.  She would like to try slightly higher dose.  Will increase to 40 mg daily.  Follow-up after her upcoming surgery.  If she has problems in the interim, she will let me know.

## 2018-06-24 NOTE — Patient Instructions (Signed)
Let us increase the indapamide to two tabs a day and I have called in a new rx for the fluoxetine at 40 mg a day. Best of luck on your surgery!  Call me with any questions. I will plan to see you sometime around mid December

## 2018-06-24 NOTE — Progress Notes (Signed)
    CHIEF COMPLAINT / HPI: #1 follow-up initiation of fluoxetine.  Feeling much better.  Both she and her family have noted improvement.  She does not feel like she is at maximal improvement however and would consider increased dose.  Still having some irritability.  Still having some sleep problems. 2.  Improved lower extremity edema.  No shortness of breath. 3.  Had some biopsies of the groin mass.  There planning surgical excision. 4.  Continues on high-dose Megace for endometrial carcinoma and abnormal uterine bleeding.  They plan to repeat place her Mirena IUD at the same time doing the surgical excision.  Hopefully she can come off the Megace at that time.  REVIEW OF SYSTEMS: No fever.  No abdominal pain.  See HPI.  PERTINENT  PMH / PSH: I have reviewed the patient's medications, allergies, past medical and surgical history, smoking status and updated in the EMR as appropriate.   OBJECTIVE:  Vital signs reviewed. GENERAL: Well-developed, well-nourished, no acute distress. CARDIOVASCULAR: Regular rate and rhythm no murmur gallop or rub LUNGS: Clear to auscultation bilaterally, no rales or wheeze. ABDOMEN: Soft positive bowel sounds NEURO: No gross focal neurological deficits. MSK: Movement of extremity x 4. AxO x4. Good eye contact.. No psychomotor retardation or agitation.Appropriate speech fluency and content.  . Asks and answers questions appropriately. Mood is congruent.    ASSESSMENT / PLAN:  Irritability If good improvement noted by both patient and her family and her irritability after we started fluoxetine.  She still having some problems sleeping at night.  She would like to try slightly higher dose.  Will increase to 40 mg daily.  Follow-up after her upcoming surgery.  If she has problems in the interim, she will let me know.  Hypertension Elevated blood pressure today.  Part of this is certainly related to her anxiety related to whitecoat hypertension.  We had started  indapamide for some lower extremity edema.  We will go ahead and increase the dose of that and follow her up after her upcoming surgery.  They will be checking blood work next week for her surgery so we will not get any today.  I will follow her up after her surgical procedure.  Abnormal uterine bleeding (AUB) They plan replacement of progesterone IUD during her upcoming surgical procedure.  Hopefully she will be able to discontinue the oral Megace at that time.  Potentially, she will be eligible for hysterectomy when she gets below 400 pounds.  Mass of left thigh Has had surgical biopsy Planned excision Surgeon estimates this tumor weighs about 60 pounds.

## 2018-06-24 NOTE — Assessment & Plan Note (Signed)
Has had surgical biopsy Planned excision Surgeon estimates this tumor weighs about 60 pounds.

## 2018-06-29 NOTE — Pre-Procedure Instructions (Signed)
Alexandra Morales  06/29/2018      [facility] [address], [place] - [address] [place] [address] Phone: [phone] Fax: [phone]    Your procedure is scheduled on Nov. 12  Report to [facility] Admitting at 5:30 A.M.  Call this number if you have problems the morning of surgery:  [phone]   Remember:  Do not eat or drink after midnight.      Take these medicines the morning of surgery with A SIP OF WATER :              Fluoxetine (prozac)             Megestrol (megace) if needed            7 days prior to surgery STOP taking any Aspirin(unless otherwise instructed by your surgeon), Aleve, Naproxen, Ibuprofen, Motrin, Advil, Goody's, BC's, all herbal medications, fish oil, and all vitamins    Do not wear jewelry, make-up or nail polish.  Do not wear lotions, powders, or perfumes, or deodorant.  Do not shave 48 hours prior to surgery.  Men may shave face and neck.  Do not bring valuables to the hospital.  [facility] is not responsible for any belongings or valuables.  Contacts, dentures or bridgework may not be worn into surgery.  Leave your suitcase in the car.  After surgery it may be brought to your room.  For patients admitted to the hospital, discharge time will be determined by your treatment team.  Patients discharged the day of surgery will not be allowed to drive home.    Special instructions:  [place]- Preparing For Surgery  Before surgery, you can play an important role. Because skin is not sterile, your skin needs to be as free of germs as possible. You can reduce the number of germs on your skin by washing with CHG (chlorahexidine gluconate) Soap before surgery.  CHG is an antiseptic cleaner which kills germs and bonds with the skin to continue killing germs even after washing.    Oral Hygiene is also important to reduce your risk of infection.  Remember - BRUSH YOUR TEETH THE MORNING OF SURGERY WITH YOUR  REGULAR TOOTHPASTE  Please do not use if you have an allergy to CHG or antibacterial soaps. If your skin becomes reddened/irritated stop using the CHG.  Do not shave (including legs and underarms) for at least 48 hours prior to first CHG shower. It is OK to shave your face.  Please follow these instructions carefully.   1. Shower the NIGHT BEFORE SURGERY and the MORNING OF SURGERY with CHG.   2. If you chose to wash your hair, wash your hair first as usual with your normal shampoo.  3. After you shampoo, rinse your hair and body thoroughly to remove the shampoo.  4. Use CHG as you would any other liquid soap. You can apply CHG directly to the skin and wash gently with a scrungie or a clean washcloth.   5. Apply the CHG Soap to your body ONLY FROM THE NECK DOWN.  Do not use on open wounds or open sores. Avoid contact with your eyes, ears, mouth and genitals (private parts). Wash Face and genitals (private parts)  with your normal soap.  6. Wash thoroughly, paying special attention to the area where your surgery will be performed.  7. Thoroughly rinse your body with warm water from the neck down.  8.  DO NOT shower/wash with your normal soap after using and rinsing off the CHG Soap.  9. Pat yourself dry with a CLEAN TOWEL.  10. Wear CLEAN PAJAMAS to bed the night before surgery, wear comfortable clothes the morning of surgery  11. Place CLEAN SHEETS on your bed the night of your first shower and DO NOT SLEEP WITH PETS.    Day of Surgery:  Do not apply any deodorants/lotions.  Please wear clean clothes to the hospital/surgery center.   Remember to brush your teeth WITH YOUR REGULAR TOOTHPASTE.    Please read over the following fact sheets that you were given. Coughing and Deep Breathing and Surgical Site Infection Prevention

## 2018-06-30 ENCOUNTER — Other Ambulatory Visit: Payer: Self-pay

## 2018-06-30 ENCOUNTER — Encounter (HOSPITAL_COMMUNITY)
Admission: RE | Admit: 2018-06-30 | Discharge: 2018-06-30 | Disposition: A | Discharge status: Home / Self Care | Payer: BLUE CROSS/BLUE SHIELD | Source: Ambulatory Visit | Attending: General Surgery | Admitting: General Surgery

## 2018-06-30 ENCOUNTER — Encounter (HOSPITAL_COMMUNITY): Payer: Self-pay

## 2018-06-30 DIAGNOSIS — R9431 Abnormal electrocardiogram [ECG] [EKG]: Secondary | ICD-10-CM | POA: Diagnosis not present

## 2018-06-30 DIAGNOSIS — Z01818 Encounter for other preprocedural examination: Secondary | ICD-10-CM | POA: Insufficient documentation

## 2018-06-30 DIAGNOSIS — R2242 Localized swelling, mass and lump, left lower limb: Secondary | ICD-10-CM | POA: Diagnosis not present

## 2018-06-30 DIAGNOSIS — I10 Essential (primary) hypertension: Secondary | ICD-10-CM | POA: Insufficient documentation

## 2018-06-30 HISTORY — DX: Family history of other specified conditions: Z84.89

## 2018-06-30 LAB — BASIC METABOLIC PANEL
ANION GAP: 10 (ref 5–15)
BUN: 25 mg/dL — ABNORMAL HIGH (ref 6–20)
CALCIUM: 8.9 mg/dL (ref 8.9–10.3)
CHLORIDE: 105 mmol/L (ref 98–111)
CO2: 24 mmol/L (ref 22–32)
Creatinine, Ser: 1.28 mg/dL — ABNORMAL HIGH (ref 0.44–1.00)
GFR calc non Af Amer: 49 mL/min — ABNORMAL LOW (ref >60)
GFR, EST AFRICAN AMERICAN: 57 mL/min — AB (ref >60)
GLUCOSE: 87 mg/dL (ref 70–99)
Potassium: 3.3 mmol/L — ABNORMAL LOW (ref 3.5–5.1)
Sodium: 139 mmol/L (ref 135–145)

## 2018-06-30 LAB — CBC
HEMATOCRIT: 39 % (ref 36.0–46.0)
HEMOGLOBIN: 10.5 g/dL — AB (ref 12.0–15.0)
MCH: 20.1 pg — ABNORMAL LOW (ref 26.0–34.0)
MCHC: 26.9 g/dL — ABNORMAL LOW (ref 30.0–36.0)
MCV: 74.6 fL — AB (ref 80.0–100.0)
Platelets: 300 10*3/uL (ref 150–400)
RBC: 5.23 MIL/uL — ABNORMAL HIGH (ref 3.87–5.11)
RDW: 19.9 % — AB (ref 11.5–15.5)
WBC: 9.1 10*3/uL (ref 4.0–10.5)
nRBC: 0 % (ref 0.0–0.2)

## 2018-06-30 NOTE — Progress Notes (Addendum)
PCP - Dr. [name] Cardiologist - denies  Chest x-ray - N/A EKG - 06/30/18 Stress Test - denies ECHO - denies Cardiac Cath - denies  Sleep Study - denies; stop bang score-4.   Aspirin Instructions: N/A  Anesthesia review: yes, ekg review  Patient denies shortness of breath, fever, cough and chest pain at PAT appointment   Patient verbalized understanding of instructions that were given to them at the PAT appointment. Patient was also instructed that they will need to review over the PAT instructions again at home before surgery.

## 2018-07-01 ENCOUNTER — Other Ambulatory Visit: Payer: Self-pay | Admitting: Gynecologic Oncology

## 2018-07-01 DIAGNOSIS — C541 Malignant neoplasm of endometrium: Secondary | ICD-10-CM

## 2018-07-01 MED ORDER — LEVONORGESTREL 20 MCG/24HR IU IUD: 1.0000 | INTRAUTERINE_SYSTEM | Freq: Once | INTRAUTERINE | 0 refills | Status: AC

## 2018-07-01 MED FILL — MIRENA SYSTEM: 20 | 30 days supply | Qty: 1 | Fill #0

## 2018-07-01 NOTE — Progress Notes (Signed)
IUD is needed for joint procedure at [facility] on 07/07/18.

## 2018-07-01 NOTE — Anesthesia Preprocedure Evaluation (Addendum)
Anesthesia Evaluation  Patient identified by MRN, date of birth, ID band Patient awake    Reviewed: Allergy & Precautions, NPO status , Patient's Chart, lab work & pertinent test results  Airway Mallampati: I  TM Distance: >3 FB Neck ROM: Full    Dental no notable dental hx. (+) Missing, Chipped, Dental Advisory Given,    Pulmonary neg pulmonary ROS,    Pulmonary exam normal breath sounds clear to auscultation       Cardiovascular hypertension, negative cardio ROS Normal cardiovascular exam Rhythm:Regular Rate:Normal     Neuro/Psych negative neurological ROS  negative psych ROS   GI/Hepatic negative GI ROS, Neg liver ROS,   Endo/Other  Morbid obesity  Renal/GU negative Renal ROS  negative genitourinary   Musculoskeletal negative musculoskeletal ROS (+)   Abdominal   Peds  Hematology  (+) Blood dyscrasia, anemia ,   Anesthesia Other Findings Abnormal uterine bleeding: Recently admitted 01/2018 with Hgb 4.4. Received 4U RBCs. Most recent Hgb 10.5  Left thigh mass  Reproductive/Obstetrics Endometrial CA, PCOS                          Anesthesia Physical Anesthesia Plan  ASA: III  Anesthesia Plan: General   Post-op Pain Management:    Induction: Intravenous  PONV Risk Score and Plan: 3 and Treatment may vary due to age or medical condition, Ondansetron and Midazolam  Airway Management Planned: Oral ETT and LMA  Additional Equipment:   Intra-op Plan:   Post-operative Plan: Extubation in OR  Informed Consent: I have reviewed the patients History and Physical, chart, labs and discussed the procedure including the risks, benefits and alternatives for the proposed anesthesia with the patient or authorized representative who has indicated his/her understanding and acceptance.   Dental advisory given  Plan Discussed with: CRNA  Anesthesia Plan Comments: (See PAT note 06/30/2018 by  Karoline Caldwell, PA-C )       Anesthesia Quick Evaluation

## 2018-07-01 NOTE — Progress Notes (Signed)
Anesthesia Chart Review:  Case:  [id number] Date/Time:  07/07/18 0715   Procedures:      EXCISION OF lower left extremity mass (Left )     DILATATION AND CURETTAGE (N/A )     INTRAUTERINE DEVICE (IUD) INSERTION, MIRENA (N/A )   Anesthesia type:  General   Pre-op diagnosis:  lower left extremity mass   Location:  [facility] ROOM 09 / [facility]   Surgeon:  Ralene Ok, MD; [name], MD      DISCUSSION: 47 yo female never smoker. Pertinent hx includes HTN, Anemia due to abn uterine bleeding, Endometrial CA, PCOS, Severe morbid obesity BMI 72.  Pt admitted 6/14-6/16/19 for symptomatic anemia with a hemoglobin of 4.4.  Anemia panel was consistent with iron deficiency anemia.  She was transfused two units with a resulting post-transfusion hemoglobin of 5.4 on 6/15.  She was then transfused two more units with a post-transfusion hemoglobin of 7.0. Hgb has continued to trend up, 10.5 on PAT labs.  Likely more difficult intubation due to habitus. Will need DOS eval by assigned anesthesiologist.   VS: BP (!) 189/82   Pulse 92   Temp 37.2 C   Resp 20   Ht 5\' 7"  (1.702 m)   Wt (!) 208.2 kg   SpO2 95%   BMI 71.87 kg/m   PROVIDERS: [name], MD is PCP   LABS: Labs reviewed: Acceptable for surgery. Mild anemia and mild creatinine elevation. Review of previous labs shows similar. (all labs ordered are listed, but only abnormal results are displayed)  Labs Reviewed  CBC - Abnormal; Notable for the following components:      Result Value   RBC 5.23 (*)    Hemoglobin 10.5 (*)    MCV 74.6 (*)    MCH 20.1 (*)    MCHC 26.9 (*)    RDW 19.9 (*)    All other components within normal limits  BASIC METABOLIC PANEL - Abnormal; Notable for the following components:   Potassium 3.3 (*)    BUN 25 (*)    Creatinine, Ser 1.28 (*)    GFR calc non Af Amer 49 (*)    GFR calc Af Amer 57 (*)    All other components within normal limits     IMAGES: CT Left Femur 02/08/2018: IMPRESSION: Fatty  lesion in the medial aspect of the left thigh is most consistent with a lipoma. Extensive stranding throughout lipoma could be due to cellulitis or necrosis.  EKG: 06/30/2018: Normal sinus rhythm. Rate 89. Prolonged QT. QT/QTc 408/496 ms.  CV: N/A  Past Medical History:  Diagnosis Date  . Anemia   . Cancer ([place])   . Endometrial cancer ([place]) 09/01/2014  . Family history of adverse reaction to anesthesia    Aunt-PONV  . History of blood transfusion 04/26/2014   [facility] - 5 Units transfused  . Hypertension   . PCOS (polycystic ovarian syndrome)    tx with metformin    Past Surgical History:  Procedure Laterality Date  . CHOLECYSTECTOMY    . CORNEAL TRANSPLANT Right   . HYSTEROSCOPY W/D&C  08/22/2014   Procedure: DILATATION AND CURETTAGE /HYSTEROSCOPY, Exam Under Anesthesia, Pap Smear, Mirena Insertion;  Surgeon: Lahoma Crocker, MD;  Location: [facility];  Service: Gynecology;;  . Arnetha Courser TOOTH EXTRACTION      MEDICATIONS: . ferrous sulfate 325 (65 FE) MG tablet  . FLUoxetine (PROZAC) 20 MG capsule  . FLUoxetine (PROZAC) 40 MG capsule  . ibuprofen (ADVIL,MOTRIN) 200 MG tablet  .  indapamide (LOZOL) 2.5 MG tablet  . megestrol (MEGACE) 40 MG tablet   No current facility-administered medications for this encounter.     Wynonia Musty [facility] Short Stay Center/Anesthesiology Phone [phone] 07/01/2018 11:13 AM

## 2018-07-06 MED ORDER — DEXTROSE 5 % IV SOLN
3.0000 g | INTRAVENOUS | Status: AC
  Administered 2018-07-07: 3 g via INTRAVENOUS
  Filled 2018-07-06: qty 3

## 2018-07-07 ENCOUNTER — Encounter (HOSPITAL_COMMUNITY): Payer: Self-pay | Admitting: Urology

## 2018-07-07 ENCOUNTER — Other Ambulatory Visit: Payer: Self-pay

## 2018-07-07 ENCOUNTER — Ambulatory Visit (HOSPITAL_COMMUNITY): Payer: BLUE CROSS/BLUE SHIELD | Admitting: Anesthesiology

## 2018-07-07 ENCOUNTER — Encounter (HOSPITAL_COMMUNITY)
Admission: RE | Disposition: A | Discharge status: Home / Self Care | Payer: Self-pay | Source: Ambulatory Visit | Attending: General Surgery

## 2018-07-07 ENCOUNTER — Observation Stay (HOSPITAL_COMMUNITY)
Admission: RE | Admit: 2018-07-07 | Discharge: 2018-07-08 | Disposition: A | Discharge status: Home / Self Care | Payer: BLUE CROSS/BLUE SHIELD | Source: Ambulatory Visit | Attending: General Surgery | Admitting: General Surgery

## 2018-07-07 ENCOUNTER — Ambulatory Visit (HOSPITAL_COMMUNITY): Payer: BLUE CROSS/BLUE SHIELD | Admitting: Physician Assistant

## 2018-07-07 DIAGNOSIS — C541 Malignant neoplasm of endometrium: Principal | ICD-10-CM | POA: Insufficient documentation

## 2018-07-07 DIAGNOSIS — Z6841 Body Mass Index (BMI) 40.0 and over, adult: Secondary | ICD-10-CM | POA: Insufficient documentation

## 2018-07-07 DIAGNOSIS — Z79899 Other long term (current) drug therapy: Secondary | ICD-10-CM | POA: Insufficient documentation

## 2018-07-07 DIAGNOSIS — N870 Mild cervical dysplasia: Secondary | ICD-10-CM | POA: Diagnosis not present

## 2018-07-07 DIAGNOSIS — Z9049 Acquired absence of other specified parts of digestive tract: Secondary | ICD-10-CM | POA: Diagnosis not present

## 2018-07-07 DIAGNOSIS — Z803 Family history of malignant neoplasm of breast: Secondary | ICD-10-CM | POA: Diagnosis not present

## 2018-07-07 DIAGNOSIS — I10 Essential (primary) hypertension: Secondary | ICD-10-CM | POA: Insufficient documentation

## 2018-07-07 DIAGNOSIS — Z809 Family history of malignant neoplasm, unspecified: Secondary | ICD-10-CM | POA: Insufficient documentation

## 2018-07-07 DIAGNOSIS — D649 Anemia, unspecified: Secondary | ICD-10-CM | POA: Diagnosis not present

## 2018-07-07 DIAGNOSIS — D1724 Benign lipomatous neoplasm of skin and subcutaneous tissue of left leg: Secondary | ICD-10-CM | POA: Insufficient documentation

## 2018-07-07 DIAGNOSIS — R2242 Localized swelling, mass and lump, left lower limb: Secondary | ICD-10-CM | POA: Diagnosis present

## 2018-07-07 DIAGNOSIS — E282 Polycystic ovarian syndrome: Secondary | ICD-10-CM | POA: Diagnosis not present

## 2018-07-07 DIAGNOSIS — Z888 Allergy status to other drugs, medicaments and biological substances status: Secondary | ICD-10-CM | POA: Insufficient documentation

## 2018-07-07 DIAGNOSIS — Z947 Corneal transplant status: Secondary | ICD-10-CM | POA: Diagnosis not present

## 2018-07-07 DIAGNOSIS — D492 Neoplasm of unspecified behavior of bone, soft tissue, and skin: Secondary | ICD-10-CM | POA: Diagnosis not present

## 2018-07-07 HISTORY — PX: EXCISION OF KELOID: SHX6267

## 2018-07-07 HISTORY — PX: EXCISION MASS LOWER EXTREMETIES: SHX6705

## 2018-07-07 HISTORY — PX: DILATION AND CURETTAGE OF UTERUS: SHX78

## 2018-07-07 HISTORY — PX: INTRAUTERINE DEVICE (IUD) INSERTION: SHX5877

## 2018-07-07 LAB — CBC
HCT: 34.6 % — ABNORMAL LOW (ref 36.0–46.0)
Hemoglobin: 9.2 g/dL — ABNORMAL LOW (ref 12.0–15.0)
MCH: 19.7 pg — AB (ref 26.0–34.0)
MCHC: 26.6 g/dL — AB (ref 30.0–36.0)
MCV: 73.9 fL — AB (ref 80.0–100.0)
NRBC: 0 % (ref 0.0–0.2)
PLATELETS: 259 10*3/uL (ref 150–400)
RBC: 4.68 MIL/uL (ref 3.87–5.11)
RDW: 19.7 % — AB (ref 11.5–15.5)
WBC: 12.1 10*3/uL — ABNORMAL HIGH (ref 4.0–10.5)

## 2018-07-07 LAB — POCT PREGNANCY, URINE: PREG TEST UR: NEGATIVE

## 2018-07-07 SURGERY — EXCISION, KELOID
Anesthesia: General | Site: Leg Upper

## 2018-07-07 MED ORDER — SUCCINYLCHOLINE CHLORIDE 20 MG/ML IJ SOLN
INTRAMUSCULAR | Status: DC | PRN
  Administered 2018-07-07: 200 mg via INTRAVENOUS

## 2018-07-07 MED ORDER — FENTANYL CITRATE (PF) 100 MCG/2ML IJ SOLN
25.0000 ug | INTRAMUSCULAR | Status: DC | PRN
  Administered 2018-07-07 (×2): 50 ug via INTRAVENOUS

## 2018-07-07 MED ORDER — CHLORHEXIDINE GLUCONATE CLOTH 2 % EX PADS: 6.0000 | MEDICATED_PAD | Freq: Once | CUTANEOUS | Status: DC

## 2018-07-07 MED ORDER — ONDANSETRON HCL 4 MG/2ML IJ SOLN
INTRAMUSCULAR | Status: AC
  Filled 2018-07-07: qty 2

## 2018-07-07 MED ORDER — SUGAMMADEX SODIUM 500 MG/5ML IV SOLN
INTRAVENOUS | Status: DC | PRN
  Administered 2018-07-07: 500 mg via INTRAVENOUS

## 2018-07-07 MED ORDER — MENTHOL 3 MG MT LOZG
1.0000 | LOZENGE | OROMUCOSAL | Status: DC | PRN
  Administered 2018-07-07: 3 mg via ORAL
  Filled 2018-07-07: qty 9

## 2018-07-07 MED ORDER — SODIUM CHLORIDE 0.9 % IV SOLN
INTRAVENOUS | Status: DC | PRN
  Administered 2018-07-07: 50 ug/min via INTRAVENOUS

## 2018-07-07 MED ORDER — FENTANYL CITRATE (PF) 250 MCG/5ML IJ SOLN
INTRAMUSCULAR | Status: DC | PRN
  Administered 2018-07-07 (×2): 50 ug via INTRAVENOUS
  Administered 2018-07-07: 100 ug via INTRAVENOUS

## 2018-07-07 MED ORDER — ONDANSETRON HCL 4 MG/2ML IJ SOLN: 4.0000 mg | Freq: Four times a day (QID) | INTRAMUSCULAR | Status: DC | PRN

## 2018-07-07 MED ORDER — LACTATED RINGERS IV SOLN
INTRAVENOUS | Status: DC | PRN
  Administered 2018-07-07 (×2): via INTRAVENOUS

## 2018-07-07 MED ORDER — BUPIVACAINE HCL (PF) 0.25 % IJ SOLN
INTRAMUSCULAR | Status: AC
  Filled 2018-07-07: qty 30

## 2018-07-07 MED ORDER — CELECOXIB 200 MG PO CAPS
ORAL_CAPSULE | ORAL | Status: AC
  Administered 2018-07-07: 200 mg via ORAL
  Filled 2018-07-07: qty 1

## 2018-07-07 MED ORDER — OXYCODONE HCL 5 MG PO TABS
5.0000 mg | ORAL_TABLET | ORAL | Status: DC | PRN
  Administered 2018-07-07 – 2018-07-08 (×5): 10 mg via ORAL
  Filled 2018-07-07 (×5): qty 2

## 2018-07-07 MED ORDER — GABAPENTIN 300 MG PO CAPS
ORAL_CAPSULE | ORAL | Status: AC
  Administered 2018-07-07: 300 mg via ORAL
  Filled 2018-07-07: qty 1

## 2018-07-07 MED ORDER — CELECOXIB 200 MG PO CAPS
200.0000 mg | ORAL_CAPSULE | ORAL | Status: AC
  Administered 2018-07-07: 200 mg via ORAL

## 2018-07-07 MED ORDER — 0.9 % SODIUM CHLORIDE (POUR BTL) OPTIME
TOPICAL | Status: DC | PRN
  Administered 2018-07-07: 1000 mL

## 2018-07-07 MED ORDER — TRAMADOL HCL 50 MG PO TABS: 50.0000 mg | ORAL_TABLET | Freq: Four times a day (QID) | ORAL | Status: DC | PRN

## 2018-07-07 MED ORDER — ROCURONIUM BROMIDE 10 MG/ML (PF) SYRINGE
PREFILLED_SYRINGE | INTRAVENOUS | Status: DC | PRN
  Administered 2018-07-07: 40 mg via INTRAVENOUS
  Administered 2018-07-07: 50 mg via INTRAVENOUS
  Administered 2018-07-07: 10 mg via INTRAVENOUS

## 2018-07-07 MED ORDER — IBUPROFEN 600 MG PO TABS: 600.0000 mg | ORAL_TABLET | Freq: Three times a day (TID) | ORAL | Status: DC | PRN

## 2018-07-07 MED ORDER — DEXTROSE-NACL 5-0.9 % IV SOLN
INTRAVENOUS | Status: DC
  Administered 2018-07-07 – 2018-07-08 (×2): via INTRAVENOUS

## 2018-07-07 MED ORDER — FERROUS SULFATE 325 (65 FE) MG PO TABS
325.0000 mg | ORAL_TABLET | Freq: Three times a day (TID) | ORAL | Status: DC
  Administered 2018-07-07 – 2018-07-08 (×3): 325 mg via ORAL
  Filled 2018-07-07 (×3): qty 1

## 2018-07-07 MED ORDER — HYDROMORPHONE HCL 1 MG/ML IJ SOLN: 1.0000 mg | INTRAMUSCULAR | Status: DC | PRN

## 2018-07-07 MED ORDER — FENTANYL CITRATE (PF) 100 MCG/2ML IJ SOLN
INTRAMUSCULAR | Status: AC
  Filled 2018-07-07: qty 2

## 2018-07-07 MED ORDER — FLUOXETINE HCL 20 MG PO CAPS: 40.0000 mg | ORAL_CAPSULE | Freq: Every day | ORAL | Status: DC

## 2018-07-07 MED ORDER — MIDAZOLAM HCL 2 MG/2ML IJ SOLN
INTRAMUSCULAR | Status: AC
  Filled 2018-07-07: qty 2

## 2018-07-07 MED ORDER — ACETAMINOPHEN 500 MG PO TABS
ORAL_TABLET | ORAL | Status: AC
  Administered 2018-07-07: 1000 mg via ORAL
  Filled 2018-07-07: qty 2

## 2018-07-07 MED ORDER — MIDAZOLAM HCL 2 MG/2ML IJ SOLN
INTRAMUSCULAR | Status: DC | PRN
  Administered 2018-07-07: 2 mg via INTRAVENOUS

## 2018-07-07 MED ORDER — GABAPENTIN 300 MG PO CAPS
300.0000 mg | ORAL_CAPSULE | ORAL | Status: AC
  Administered 2018-07-07: 300 mg via ORAL

## 2018-07-07 MED ORDER — GLYCOPYRROLATE PF 0.2 MG/ML IJ SOSY
PREFILLED_SYRINGE | INTRAMUSCULAR | Status: DC | PRN
  Administered 2018-07-07: .2 mg via INTRAVENOUS

## 2018-07-07 MED ORDER — PROPOFOL 10 MG/ML IV BOLUS
INTRAVENOUS | Status: AC
  Filled 2018-07-07: qty 40

## 2018-07-07 MED ORDER — LIDOCAINE 2% (20 MG/ML) 5 ML SYRINGE
INTRAMUSCULAR | Status: DC | PRN
  Administered 2018-07-07: 100 mg via INTRAVENOUS

## 2018-07-07 MED ORDER — FLUOXETINE HCL 20 MG PO CAPS
40.0000 mg | ORAL_CAPSULE | Freq: Every day | ORAL | Status: DC
  Administered 2018-07-08: 40 mg via ORAL
  Filled 2018-07-07: qty 2

## 2018-07-07 MED ORDER — FENTANYL CITRATE (PF) 250 MCG/5ML IJ SOLN
INTRAMUSCULAR | Status: AC
  Filled 2018-07-07: qty 5

## 2018-07-07 MED ORDER — LIDOCAINE HCL 1 % IJ SOLN
INTRAMUSCULAR | Status: AC
  Filled 2018-07-07: qty 20

## 2018-07-07 MED ORDER — ONDANSETRON HCL 4 MG/2ML IJ SOLN
INTRAMUSCULAR | Status: DC | PRN
  Administered 2018-07-07: 4 mg via INTRAVENOUS

## 2018-07-07 MED ORDER — SUGAMMADEX SODIUM 500 MG/5ML IV SOLN
INTRAVENOUS | Status: AC
  Filled 2018-07-07: qty 5

## 2018-07-07 MED ORDER — LEVONORGESTREL 20 MCG/24HR IU IUD: 1.0000 | INTRAUTERINE_SYSTEM | Freq: Once | INTRAUTERINE | Status: DC

## 2018-07-07 MED ORDER — PROPOFOL 10 MG/ML IV BOLUS
INTRAVENOUS | Status: DC | PRN
  Administered 2018-07-07: 150 mg via INTRAVENOUS

## 2018-07-07 MED ORDER — ONDANSETRON 4 MG PO TBDP: 4.0000 mg | ORAL_TABLET | Freq: Four times a day (QID) | ORAL | Status: DC | PRN

## 2018-07-07 MED ORDER — INDAPAMIDE 2.5 MG PO TABS
5.0000 mg | ORAL_TABLET | Freq: Every day | ORAL | Status: DC
  Administered 2018-07-07: 5 mg via ORAL
  Filled 2018-07-07: qty 2

## 2018-07-07 MED ORDER — ACETAMINOPHEN 500 MG PO TABS
1000.0000 mg | ORAL_TABLET | ORAL | Status: AC
  Administered 2018-07-07: 1000 mg via ORAL

## 2018-07-07 MED ORDER — PHENOL 1.4 % MT LIQD: 1.0000 | OROMUCOSAL | Status: DC | PRN

## 2018-07-07 SURGICAL SUPPLY — 51 items
BLADE SURG 15 STRL LF DISP TIS (BLADE) ×2 IMPLANT
BLADE SURG 15 STRL SS (BLADE) ×1
CANISTER SUCT 3000ML (MISCELLANEOUS) IMPLANT
CHLORAPREP W/TINT 26ML (MISCELLANEOUS) ×3 IMPLANT
COVER SURGICAL LIGHT HANDLE (MISCELLANEOUS) ×3 IMPLANT
COVER WAND RF STERILE (DRAPES) ×3 IMPLANT
DECANTER SPIKE VIAL GLASS SM (MISCELLANEOUS) ×6 IMPLANT
DERMABOND ADVANCED (GAUZE/BANDAGES/DRESSINGS) ×1
DERMABOND ADVANCED .7 DNX12 (GAUZE/BANDAGES/DRESSINGS) ×2 IMPLANT
DRAPE LAPAROTOMY 100X72 PEDS (DRAPES) ×3 IMPLANT
DRAPE UTILITY XL STRL (DRAPES) ×3 IMPLANT
ELECT CAUTERY BLADE 6.4 (BLADE) ×3 IMPLANT
ELECT REM PT RETURN 9FT ADLT (ELECTROSURGICAL) ×3
ELECTRODE REM PT RTRN 9FT ADLT (ELECTROSURGICAL) ×2 IMPLANT
GAUZE SPONGE 4X4 12PLY STRL LF (GAUZE/BANDAGES/DRESSINGS) ×1 IMPLANT
GLOVE BIO SURGEON STRL SZ7.5 (GLOVE) ×3 IMPLANT
GLOVE BIOGEL PI IND STRL 8 (GLOVE) ×2 IMPLANT
GLOVE BIOGEL PI INDICATOR 8 (GLOVE) ×1
GOWN STRL REUS W/ TWL LRG LVL3 (GOWN DISPOSABLE) ×2 IMPLANT
GOWN STRL REUS W/ TWL XL LVL3 (GOWN DISPOSABLE) ×2 IMPLANT
GOWN STRL REUS W/TWL LRG LVL3 (GOWN DISPOSABLE) ×1
GOWN STRL REUS W/TWL XL LVL3 (GOWN DISPOSABLE) ×1
KIT BASIN OR (CUSTOM PROCEDURE TRAY) ×3 IMPLANT
KIT TURNOVER KIT B (KITS) ×3 IMPLANT
Mirena IUD ×1 IMPLANT
NDL HYPO 25GX1X1/2 BEV (NEEDLE) ×2 IMPLANT
NEEDLE HYPO 25GX1X1/2 BEV (NEEDLE) ×3 IMPLANT
NS IRRIG 1000ML POUR BTL (IV SOLUTION) ×3 IMPLANT
PACK SURGICAL SETUP 50X90 (CUSTOM PROCEDURE TRAY) ×3 IMPLANT
PAD ABD 8X10 STRL (GAUZE/BANDAGES/DRESSINGS) ×1 IMPLANT
PAD ARMBOARD 7.5X6 YLW CONV (MISCELLANEOUS) ×3 IMPLANT
PENCIL BUTTON HOLSTER BLD 10FT (ELECTRODE) ×3 IMPLANT
PENCIL SMOKE EVAC W/HOLSTER (ELECTROSURGICAL) ×1 IMPLANT
SPONGE LAP 18X18 RF (DISPOSABLE) ×3 IMPLANT
STAPLER VISISTAT 35W (STAPLE) ×2 IMPLANT
SUT ETHILON 1 TP 1 60 (SUTURE) ×2 IMPLANT
SUT MNCRL AB 4-0 PS2 18 (SUTURE) ×3 IMPLANT
SUT SILK 2 0 (SUTURE) ×1
SUT SILK 2-0 18XBRD TIE 12 (SUTURE) IMPLANT
SUT VIC AB 0 CT1 27 (SUTURE) ×2
SUT VIC AB 0 CT1 27XBRD ANBCTR (SUTURE) IMPLANT
SUT VIC AB 2-0 CT1 18 (SUTURE) ×4 IMPLANT
SUT VIC AB 3-0 SH 27 (SUTURE) ×1
SUT VIC AB 3-0 SH 27XBRD (SUTURE) ×2 IMPLANT
SUT VIC AB 3-0 SH 8-18 (SUTURE) ×1 IMPLANT
SYR BULB 3OZ (MISCELLANEOUS) ×3 IMPLANT
SYR CONTROL 10ML LL (SYRINGE) ×3 IMPLANT
TOWEL OR 17X24 6PK STRL BLUE (TOWEL DISPOSABLE) ×3 IMPLANT
TOWEL OR 17X26 10 PK STRL BLUE (TOWEL DISPOSABLE) ×3 IMPLANT
TUBE CONNECTING 12X1/4 (SUCTIONS) IMPLANT
YANKAUER SUCT BULB TIP NO VENT (SUCTIONS) IMPLANT

## 2018-07-07 NOTE — Op Note (Signed)
PATIENT: [name] ENCOUNTER DATE: 07/07/18   Preop Diagnosis: endometrial cancer, morbid obesity (BMI 72 kg/m2)  Postoperative Diagnosis: same  Surgery: D&C and mirena IUD placement  Surgeons:  Donaciano Eva, MD Assistant: none  Anesthesia: General   Estimated blood loss: 25 ml  IVF:  200 ml   Urine output: 50 straight cath   Complications: None   Pathology: endometrial curettings  Operative findings: extreme morbid obesity preventing safe hysterectomy procedure. 30cm left medial thigh lipoma/mass. Uterus and cervix grossly normal. Uterus sounded to 4 inches.  No IUD encountered during exam or curette suggesting this had already been spontaneously evacuated prior to the procedure.  Minimal tissue on curetting.   Procedure: The patient was identified in the preoperative holding area. Informed consent was signed on the chart. Patient was seen history was reviewed and exam was performed.   The patient was then taken to the operating room and placed in the supine position with SCD hose on. General anesthesia was then induced without difficulty. She was then placed in the dorsolithotomy position. Additional OR personnel and time (45 minutes ) was necessary to position to the patient due to the extreme morbid obesity requiring additional equipment. The legs were carefully positioned in bariatric stirrups to ensure no pressure on boney prominences. The perineum was prepped with Betadine. The vagina was prepped with Betadine. The patient was then draped after the prep was dried. An in and out catheterization to empty the bladder was performed under sterile conditions.  Timeout was performed the patient, procedure, antibiotic, allergy, and length of procedure.   Sutures were utilized to retract the labia towards the thighs.  The weighted speculum was placed in the posterior vagina. The right angle retractor was placed anteriorally to visualize the cervix.The uterine sound was  placed in the cervix to delineate the course of the endocervical canal which was anteverted. Pratt dilators were used to dilate the uterine cervix to accommodate a curette. There was no loss of resistance or apparent perforation during this process. The curette then made 4 passes within the cavity to comprehensively evacuate the uterine cavity until a gritty feel was noted.  The Mirena IUD was obtained ( Lot number [id number], Exp Date Jan, 2022. S/N [id number]). It was retracted within the sheath, inserted to the uterine fundus at 10+cm, then deployed. Strings were cut beyond the cervical os at 5cm.   The tenaculum was removed and suction confirmed hemostasis.  Sutures were cut from the perineum and a foley catheter was inserted for Dr Ramirez's procedure (sterile gloves were placed prior to this to ensure sterile technique).   All instrument, suture, laparotomy, Ray-Tec, and needle counts were correct x2. The patient tolerated the procedure well and the procedure was continued with Dr Rosendo Gros to remove the left thigh mass (please refer to his separate dication).This is [name] dictating an operative note on Alexandra Morales.

## 2018-07-07 NOTE — Transfer of Care (Signed)
Immediate Anesthesia Transfer of Care Note  Patient: Alexandra Morales  Procedure(s) Performed: EXCISION OF LOWER LEFT EXTREMITY MASS (Left Leg Upper) DILATATION AND CURETTAGE (N/A ) INTRAUTERINE DEVICE (IUD) INSERTION, MIRENA (N/A )  Patient Location: PACU  Anesthesia Type:General  Level of Consciousness: awake, alert  and oriented  Airway & Oxygen Therapy: Patient Spontanous Breathing and Patient connected to face mask oxygen  Post-op Assessment: Report given to RN and Post -op Vital signs reviewed and stable  Post vital signs: Reviewed and stable  Last Vitals:  Vitals Value Taken Time  BP 136/72 07/07/2018 11:12 AM  Temp    Pulse 89 07/07/2018 11:16 AM  Resp 20 07/07/2018 11:16 AM  SpO2 92 % 07/07/2018 11:16 AM  Vitals shown include unvalidated device data.  Last Pain:  Vitals:   07/07/18 0612  TempSrc: Oral  PainSc:          Complications: No apparent anesthesia complications

## 2018-07-07 NOTE — Plan of Care (Signed)

## 2018-07-07 NOTE — Anesthesia Postprocedure Evaluation (Signed)
Anesthesia Post Note  Patient: Alexandra Morales  Procedure(s) Performed: EXCISION OF LOWER LEFT EXTREMITY MASS (Left Leg Upper) DILATATION AND CURETTAGE (N/A ) INTRAUTERINE DEVICE (IUD) INSERTION, MIRENA (N/A )     Patient location during evaluation: PACU Anesthesia Type: General Level of consciousness: awake and alert Pain management: pain level controlled Vital Signs Assessment: post-procedure vital signs reviewed and stable Respiratory status: spontaneous breathing, nonlabored ventilation, respiratory function stable and patient connected to nasal cannula oxygen Cardiovascular status: blood pressure returned to baseline and stable Postop Assessment: no apparent nausea or vomiting Anesthetic complications: no    Last Vitals:  Vitals:   07/07/18 1414 07/07/18 1455  BP: 124/63 (!) 160/77  Pulse: 80 84  Resp: 19 17  Temp:  36.9 C  SpO2: 93% 100%    Last Pain:  Vitals:   07/07/18 1545  TempSrc:   PainSc: 0-No pain                 [name] [name]

## 2018-07-07 NOTE — H&P (Signed)
Chief Complaint  Patient presents with  . Endometrial cancer ([place])     Assessment: history of grade 1 endometrial carcinoma. Severe morbid obesity.  Persistent uterine bleeding (without anemia) and lipoma. Not a candidate for hysterectomy due to extreme abdominal adiposity. Would consider hysterectomy if patient were to be <400lbs. Bleeding not well controlled on Megace. Unable to biopsy uterus in the office. Patient declined MRI (including open MRI)  Plan: The patient will continue on Megace 40 mg 4 times daily. Recommend D&C with placement of Mirena IUD (as this controlled her symptoms in 2015). We will plan to time this procedure with the planned excision of left thigh lipoma.    I explained why her body habitus (in particular, the distribution of her body fat) makes her a poor candidate for a deep pelvic surgery such as hysterectomy. I explained that there would not be safe visualization of her deep pelvic structures. I explained that this might be possible if she were to have more significant weight loss (< 400lbs total), however, she has gained 20lbs since July, 2019. Aspynn expressed frustration that I can't " just do the hysterectomy". She feels that the bleeding symptoms are a nuisance.   Interval history:  Began experiencing bleeding again in July, 2019. This stopped in August, 2019.  She had an MRI ordered to evaluate the uterine lining, however, despite ordering an open MRI and prescribing Ativan, she did not consent to the scan and declined it due to anxiety/claustraphobia. She is frustrated with the bleeding which she finds "tiresome".  She doesn't understand why she can't "just have a hysterectomy".   HPI: 47 year old white female who was initially seen in consultation at the request of Dr. Delsa Sale regarding management of a newly diagnosed grade 1 endometrial carcinoma. The patient presented with abnormal uterine bleeding and underwent a D&C and insertion of a  Mirena IUD on 08/22/2014.    The patient's primary problem is that she is morbidly obese weighing 471 pounds. She denies any GI or GU symptoms has no pelvic pain pressure and is only having some vaginal spotting.  She did well until early 2019 when she began to have vaginal bleeding and a hemoglobin of 4.4 necessitating 4 units of blood transfusion.  In addition to the Mirena IUD she was placed on Megace 40 mg 4 times daily which resulted in stoppage of the bleeding.  Patient returned in July, 2019 after having last been seen by our office in 2016.  She has had a Mirena IUD since December 2015 and has done well with that until proximally 3 months ago when she started having vaginal bleeding.  Apparently the bleeding got heavier and the patient was recently admitted.  She was placed on Megace 40 mg 4 times daily and the bleeding stopped.   Patient had an ultrasound recently but because of her habitus was inconclusive and endometrial stripe cannot be measured accurately. The uterine size was 12cm.   Review of Systems:10 point review of systems is negative except as noted in interval history.   Vitals: Blood pressure (!) 168/74, pulse 85, temperature 98.8 F (37.1 C), temperature source Oral, resp. rate 18, height 5\' 7"  (1.702 m), weight (!) 473 lb 12.8 oz (214.9 kg), SpO2 93 %.  Physical Exam: Gen: in no distress Abd: extreme morbid obesity with large pannus and mons pannus. No masses. Pelvic: deferred Peripheries: peau d'orange changes to lower legs with venous stasis and erythema.         Allergies  Allergen Reactions  . Tape Rash    Paper tape is tolerated best, allergic to adhesive tape.        Past Medical History:  Diagnosis Date  . Anemia   . Cancer ([place])   . Endometrial cancer ([place]) 09/01/2014  . History of blood transfusion 04/26/2014   [facility] - 5 Units transfused  . Hypertension   . PCOS (polycystic ovarian syndrome)    tx with metformin         Past  Surgical History:  Procedure Laterality Date  . CHOLECYSTECTOMY    . CORNEAL TRANSPLANT Right   . HYSTEROSCOPY W/D&C  08/22/2014   Procedure: DILATATION AND CURETTAGE /HYSTEROSCOPY, Exam Under Anesthesia, Pap Smear, Mirena Insertion;  Surgeon: Lahoma Crocker, MD;  Location: [facility];  Service: Gynecology;;  . Arnetha Courser TOOTH EXTRACTION            Current Outpatient Medications  Medication Sig Dispense Refill  . megestrol (MEGACE) 40 MG tablet Take 1 tablet (40 mg total) by mouth 2 (two) times daily. Can increase to two tablets twice a day in the event of heavy bleeding 60 tablet 6  . ferrous sulfate 325 (65 FE) MG tablet Take 325 mg by mouth 3 (three) times daily with meals.     No current facility-administered medications for this visit.     Social History        Socioeconomic History  . Marital status: Single    Spouse name: Not on file  . Number of children: Not on file  . Years of education: Not on file  . Highest education level: Not on file  Occupational History  . Not on file  Social Needs  . Financial resource strain: Not on file  . Food insecurity:    Worry: Not on file    Inability: Not on file  . Transportation needs:    Medical: Not on file    Non-medical: Not on file  Tobacco Use  . Smoking status: Never Smoker  . Smokeless tobacco: Never Used  Substance and Sexual Activity  . Alcohol use: Yes    Alcohol/week: 0.0 standard drinks    Comment: occasionally   . Drug use: No  . Sexual activity: Not Currently    Birth control/protection: IUD  Lifestyle  . Physical activity:    Days per week: Not on file    Minutes per session: Not on file  . Stress: Not on file  Relationships  . Social connections:    Talks on phone: Not on file    Gets together: Not on file    Attends religious service: Not on file    Active member of club or organization: Not on file    Attends meetings of clubs or organizations: Not on  file    Relationship status: Not on file  . Intimate partner violence:    Fear of current or ex partner: Not on file    Emotionally abused: Not on file    Physically abused: Not on file    Forced sexual activity: Not on file  Other Topics Concern  . Not on file  Social History Narrative   Lives in [place].    Co-manager at [employer]         Family History  Problem Relation Age of Onset  . Cancer Mother   . Breast cancer Mother       Thereasa Solo, MD

## 2018-07-07 NOTE — Anesthesia Procedure Notes (Signed)
Procedure Name: Intubation Date/Time: 07/07/2018 7:48 AM Performed by: Valda Favia, CRNA Pre-anesthesia Checklist: Patient identified, Emergency Drugs available, Suction available and Patient being monitored Patient Re-evaluated:Patient Re-evaluated prior to induction Oxygen Delivery Method: Circle System Utilized Preoxygenation: Pre-oxygenation with 100% oxygen Induction Type: IV induction Ventilation: Mask ventilation without difficulty, Oral airway inserted - appropriate to patient size and Two handed mask ventilation required Laryngoscope Size: Mac and 4 Grade View: Grade I Tube type: Oral Tube size: 7.0 mm Number of attempts: 1 Airway Equipment and Method: Stylet and Oral airway Placement Confirmation: ETT inserted through vocal cords under direct vision,  positive ETCO2 and breath sounds checked- equal and bilateral Secured at: 20 cm Tube secured with: Tape Dental Injury: Teeth and Oropharynx as per pre-operative assessment

## 2018-07-07 NOTE — H&P (Signed)
History of Present IllnessThe patient is a 47 year old female who presents with a complaint of left leg mass. Patient comes back in today for follow-up after core needle biopsy. Core needle biopsy revealed lipomatous tumor with fibrosis. Patient states that she continues to follow up with Dr. Denman George. She states that she is currently on Megace to help with her dysfunctional uterine bleeding.   Patient has had no other changes since her last visit.  ------------------------------------------- Patient is a 47 year old female who is referred by Dr. Denman George for evaluation and she complained of the left lower leg mass.  Patient states that the mass is been there for multiple years, 10-15 years. She states that it slowly groaning and larger. He states she's had no pain. Patient states she's had an issue with dysfunctional uterine bleeding. He's had multiple episodes of anemia requiring transfusions. Patient is to undergo MRI on this tomorrow. Patient did have a CT scan of her left femur. This was in June 2019. Did review this personally. This did reveal a large 30 x 20 x 30 cm mass most consistent with lipoma. There could be some associated necrosis with the lipoma.  Patient states that she's had no pain to the area. She states that he's had no further issues aside from difficulty with ambulation. Patient continues to work at this time.   Allergies  No Known Drug Allergies [04/22/2018]: Allergies Reconciled   Medication History Indapamide (2.5MG  Tablet, Oral) Active. FLUoxetine HCl (20MG  Capsule, Oral) Active. Megestrol Acetate (40MG  Tablet, Oral) Active. Iron (325 (65 Fe)MG Tablet, Oral) Active. Medications Reconciled    Review of Systems General Not Present- Appetite Loss, Chills, Fatigue, Fever, Night Sweats, Weight Gain and Weight Loss. Skin Not Present- Change in Wart/Mole, Dryness, Hives, Jaundice, New Lesions, Non-Healing Wounds, Rash and Ulcer. HEENT Not Present-  Earache, Hearing Loss, Hoarseness, Nose Bleed, Oral Ulcers, Ringing in the Ears, Seasonal Allergies, Sinus Pain, Sore Throat, Visual Disturbances, Wears glasses/contact lenses and Yellow Eyes. Respiratory Not Present- Bloody sputum, Chronic Cough, Difficulty Breathing, Snoring and Wheezing. Breast Not Present- Breast Mass, Breast Pain, Nipple Discharge and Skin Changes. Cardiovascular Not Present- Chest Pain, Difficulty Breathing Lying Down, Leg Cramps, Palpitations, Rapid Heart Rate, Shortness of Breath and Swelling of Extremities. Gastrointestinal Not Present- Abdominal Pain, Bloating, Bloody Stool, Change in Bowel Habits, Chronic diarrhea, Constipation, Difficulty Swallowing, Excessive gas, Gets full quickly at meals, Hemorrhoids, Indigestion, Nausea, Rectal Pain and Vomiting. Female Genitourinary Present- Nocturia. Not Present- Frequency, Painful Urination, Pelvic Pain and Urgency. Musculoskeletal Not Present- Back Pain, Joint Pain, Joint Stiffness, Muscle Pain, Muscle Weakness and Swelling of Extremities. Neurological Not Present- Decreased Memory, Fainting, Headaches, Numbness, Seizures, Tingling, Tremor, Trouble walking and Weakness. Psychiatric Not Present- Anxiety, Bipolar, Change in Sleep Pattern, Depression, Fearful and Frequent crying. Endocrine Not Present- Cold Intolerance, Excessive Hunger, Hair Changes, Heat Intolerance, Hot flashes and New Diabetes. Hematology Present- Excessive bleeding. Not Present- Blood Thinners, Easy Bruising, Gland problems, HIV and Persistent Infections. All other systems negative  BP (!) 167/71   Pulse 84   Temp 98.9 F (37.2 C) (Oral)   Ht 5\' 7"  (1.702 m)   Wt (!) 207.7 kg   LMP 04/26/2018   SpO2 95%   BMI 71.73 kg/m      Physical Exam  The physical exam findings are as follows: Note: Constitutional: No acute distress, conversant, appears stated age  Eyes: Anicteric sclerae, moist conjunctiva, no lid lag  Neck: No thyromegaly, trachea  midline, no cervical lymphadenopathy  Lungs: Clear to auscultation  biilaterally, normal respiratory effot  Cardiovascular: regular rate & rhythm, no murmurs, no peripheal edema, pedal pulses 2+  GI: Soft, no masses or hepatosplenomegaly, non-tender to palpation  MSK: Normal gait, no clubbing cyanosis, edema, left lower extremity mass, large, soft, chronic skin changes, approximately 20 x 30 x 30 cm, nontender to touch.  Skin: No rashes, palpation reveals normal skin turgor  Psychiatric: Appropriate judgment and insight, oriented to person, place, and time    Assessment & Plan LIPOMA OF LEFT LOWER EXTREMITY (D17.24) Impression: Patient is a 47 year old female large likely lipoma of left lower extremity. I did discuss with her that postoperatively she likely will require drain to help with the most likely resulting seroma. 1. Will proceed to the operating room for excision of left lower extremity mass. 2. I discussed with her the risks and benefits of the procedure to include but not limited to: Infection, bleeding, damage to structures, possible seroma, possible wound dehiscence, possible need for further surgeries. Patient voiced understanding and wished to proceed.

## 2018-07-07 NOTE — Op Note (Signed)
07/07/2018  10:44 AM  PATIENT:  Alexandra Morales  47 y.o. female  PRE-OPERATIVE DIAGNOSIS:  lower left extremity mass  POST-OPERATIVE DIAGNOSIS:  lower left extremity mass  PROCEDURE:  Procedure(s): EXCISION OF LOWER LEFT EXTREMITY MASS 50x 27x 9 cm SQ(Left)   SURGEON:  Surgeon(s) and Role:    Ralene Ok, MD - Primary    Armandina Gemma, MD - Assisting  ANESTHESIA:   general  EBL:  200 mL   BLOOD ADMINISTERED:none  DRAINS: none   LOCAL MEDICATIONS USED:  NONE  SPECIMEN:  Source of Specimen:  Left lower thigh mass 50 x 27 x 9cm  DISPOSITION OF SPECIMEN:  PATHOLOGY  COUNTS:  YES  TOURNIQUET:  * No tourniquets in log *  DICTATION: .Dragon Dictation Details of procedure: After the patient was consented she was taken back to the operating room placed in the supine position with bilateral SCDs in place.  She underwent general endotracheal intubation.  Patient underwent procedure per Dr. Serita Grit note.  She will dictate this under separate cover.  Patient was then placed in the lithotomy position.  Patient was then prepped and draped in standard fashion.  A timeout was called all facts verified.  At this time a #10 blade was used a skin incision over the area of the stalk of the mass.  Cautery was used to maintain hemostasis and dissection was taken down to the mass itself.  This was slowly and carefully dissected away from surrounding tissue.  Care was used to not harvesting the skin to allow primary closure.  All large vessels in the subcutaneous mass itself were ligated with 2-0 silks.  The mass so was circumferentially dissected away.  Once the mass was cauterized away from the left lower thigh this was sent to pathology.  The mass itself measured 50 cm x 27 cm x 9 cm.  The mass did not go deeper than the fascia itself.  At this time the area was checked for hemostasis.  The skin was cauterized in the deep dermal area to help with hemostasis.  At this time the skin was  easily approximated without undue tension.  We  then reapproximated the skin in a deep dermal fashion using 3-0 Vicryl in interrupted fashion along the length of the skin incision.  At this time the skin incision was then closed with skin staples.  Ace bandage, and Kerlix were then used to wrap the leg over the incision site.  The patient tolerated procedure well was taken to the recovery room in stable condition.  PLAN OF CARE: Admit for overnight observation  PATIENT DISPOSITION:  PACU - hemodynamically stable.   Delay start of Pharmacological VTE agent (>24hrs) due to surgical blood loss or risk of bleeding: yes

## 2018-07-08 DIAGNOSIS — D649 Anemia, unspecified: Secondary | ICD-10-CM | POA: Diagnosis not present

## 2018-07-08 DIAGNOSIS — Z9049 Acquired absence of other specified parts of digestive tract: Secondary | ICD-10-CM | POA: Diagnosis not present

## 2018-07-08 DIAGNOSIS — Z888 Allergy status to other drugs, medicaments and biological substances status: Secondary | ICD-10-CM | POA: Diagnosis not present

## 2018-07-08 DIAGNOSIS — Z6841 Body Mass Index (BMI) 40.0 and over, adult: Secondary | ICD-10-CM | POA: Diagnosis not present

## 2018-07-08 DIAGNOSIS — Z803 Family history of malignant neoplasm of breast: Secondary | ICD-10-CM | POA: Diagnosis not present

## 2018-07-08 DIAGNOSIS — C541 Malignant neoplasm of endometrium: Secondary | ICD-10-CM | POA: Diagnosis not present

## 2018-07-08 DIAGNOSIS — Z947 Corneal transplant status: Secondary | ICD-10-CM | POA: Diagnosis not present

## 2018-07-08 DIAGNOSIS — Z79899 Other long term (current) drug therapy: Secondary | ICD-10-CM | POA: Diagnosis not present

## 2018-07-08 DIAGNOSIS — R2242 Localized swelling, mass and lump, left lower limb: Secondary | ICD-10-CM | POA: Diagnosis not present

## 2018-07-08 DIAGNOSIS — E282 Polycystic ovarian syndrome: Secondary | ICD-10-CM | POA: Diagnosis not present

## 2018-07-08 DIAGNOSIS — I10 Essential (primary) hypertension: Secondary | ICD-10-CM | POA: Diagnosis not present

## 2018-07-08 DIAGNOSIS — Z809 Family history of malignant neoplasm, unspecified: Secondary | ICD-10-CM | POA: Diagnosis not present

## 2018-07-08 DIAGNOSIS — N870 Mild cervical dysplasia: Secondary | ICD-10-CM | POA: Diagnosis not present

## 2018-07-08 DIAGNOSIS — D1724 Benign lipomatous neoplasm of skin and subcutaneous tissue of left leg: Secondary | ICD-10-CM | POA: Diagnosis not present

## 2018-07-08 MED ORDER — TRAMADOL HCL 50 MG PO TABS: 50.0000 mg | ORAL_TABLET | Freq: Four times a day (QID) | ORAL | 0 refills | Status: DC | PRN

## 2018-07-08 MED ORDER — OXYCODONE HCL 5 MG PO TABS: 5.0000 mg | ORAL_TABLET | ORAL | 0 refills | Status: DC | PRN

## 2018-07-08 NOTE — Discharge Summary (Signed)
Physician Discharge Summary  Patient ID: Alexandra Morales MRN: [id number] DOB/AGE: 01/22/1971 47 y.o.  Admit date: 07/07/2018 Discharge date: 07/08/2018  Admission Diagnoses: s/p LLE mass removal, D&C, IUD placement  Discharge Diagnoses:  Active Problems:   Endometrial cancer ([place])   Mass of left thigh   Discharged Condition: good  Hospital Course: Post op pt went to the floor.  Please see op note for full details.  She was started on a clear diet and adv as tol.  She tol reg diet prior to Dc.  She had good pain control and was ambulating well on her own.  Her dressings were clean and dry.  She was afebrile, deemed stable for DC and Dc'd home.  Consults: none  Significant Diagnostic Studies: none   Treatments: surgery: as above  Discharge Exam: Blood pressure (!) 153/72, pulse 88, temperature 98.7 F (37.1 C), temperature source Oral, resp. rate 18, height 5\' 7"  (1.702 m), weight (!) 207.7 kg, last menstrual period 04/26/2018, SpO2 94 %. General appearance: alert and cooperative Extremities: dressing in place, c/d/i  Disposition: Discharge disposition: 01-Home or Self Care       Discharge Instructions    Diet - low sodium heart healthy   Complete by:  As directed    Increase activity slowly   Complete by:  As directed      Allergies as of 07/08/2018      Reactions   Tape Rash, Other (See Comments)   Paper tape is tolerated best, allergic to adhesive tape.      Medication List    TAKE these medications   ferrous sulfate 325 (65 FE) MG tablet Take 325 mg by mouth 3 (three) times daily with meals.   FLUoxetine 20 MG capsule Commonly known as:  PROZAC Take 40 mg by mouth daily.   FLUoxetine 40 MG capsule Commonly known as:  PROZAC Take 1 capsule (40 mg total) by mouth daily.   ibuprofen 200 MG tablet Commonly known as:  ADVIL,MOTRIN Take 600 mg by mouth every 8 (eight) hours as needed (for pain.).   indapamide 2.5 MG tablet Commonly known as:   LOZOL Take 2 tabs by mouth daily What changed:    how much to take  how to take this  when to take this  additional instructions   levonorgestrel 20 MCG/24HR IUD Commonly known as:  MIRENA 1 Intra Uterine Device (1 each total) by Intrauterine route once for 1 dose. For OR procedure on 11/12   megestrol 40 MG tablet Commonly known as:  MEGACE Take 1 tablet (40 mg total) by mouth 2 (two) times daily. Can increase to two tablets twice a day in the event of heavy bleeding What changed:    how much to take  when to take this  reasons to take this  additional instructions   oxyCODONE 5 MG immediate release tablet Commonly known as:  Oxy IR/ROXICODONE Take 1-2 tablets (5-10 mg total) by mouth every 4 (four) hours as needed for moderate pain.   traMADol 50 MG tablet Commonly known as:  ULTRAM Take 1 tablet (50 mg total) by mouth every 6 (six) hours as needed (mild pain).      Follow-up Information    [name], MD In 4 weeks.   Specialty:  Gynecologic Oncology Contact information: [place] [address] [phone]        Ralene Ok, MD. Schedule an appointment as soon as possible for a visit in 1 week(s).   Specialty:  General Surgery Why:  staple removal Contact information: [address] [place] [address] [phone]           Signed: Ralene Ok 07/08/2018, 8:19 AM

## 2018-07-08 NOTE — Discharge Instructions (Signed)
Lipoma Removal, Care After Refer to this sheet in the next few weeks. These instructions provide you with information about caring for yourself after your procedure. Your health care provider may also give you more specific instructions. Your treatment has been planned according to current medical practices, but problems sometimes occur. Call your health care provider if you have any problems or questions after your procedure. What can I expect after the procedure? After the procedure, it is common to have:  Mild pain.  Swelling.  Bruising.  Follow these instructions at home:  Bathing  Do not take baths, swim, or use a hot tub until your health care provider approves. Ask your health care provider if you can take showers. You may only be allowed to take sponge baths for bathing.  Keep your bandage (dressing) dry until your health care provider says it can be removed. Incision care   Follow instructions from your health care provider about how to take care of your incision. Make sure you: ? Wash your hands with soap and water before you change your bandage (dressing). If soap and water are not available, use hand sanitizer. ? Change your dressing as told by your health care provider. ? Leave stitches (sutures), skin glue, or adhesive strips in place. These skin closures may need to stay in place for 2 weeks or longer. If adhesive strip edges start to loosen and curl up, you may trim the loose edges. Do not remove adhesive strips completely unless your health care provider tells you to do that.  Check your incision area every day for signs of infection. Check for: ? More redness, swelling, or pain. ? Fluid or blood. ? Warmth. ? Pus or a bad smell. Driving  Do not drive or operate heavy machinery while taking prescription pain medicine.  Do not drive for 24 hours if you received a medicine to help you relax (sedative) during your procedure.  Ask your health care provider when it is  safe for you to drive. General instructions  Take over-the-counter and prescription medicines only as told by your health care provider.  Do not use any tobacco products, such as cigarettes, chewing tobacco, and e-cigarettes. These can delay healing. If you need help quitting, ask your health care provider.  Return to your normal activities as told by your health care provider. Ask your health care provider what activities are safe for you.  Keep all follow-up visits as told by your health care provider. This is important. Contact a health care provider if:  You have more redness, swelling, or pain around your incision.  You have fluid or blood coming from your incision.  Your incision feels warm to the touch.  You have pus or a bad smell coming from your incision.  You have pain that does not get better with medicine. Get help right away if:  You have chills or a fever.  You have severe pain. This information is not intended to replace advice given to you by your health care provider. Make sure you discuss any questions you have with your health care provider. Document Released: 10/26/2015 Document Revised: 01/23/2016 Document Reviewed: 10/26/2015 Elsevier Interactive Patient Education  2018 [place].   D&C, Care After ACTIVITY  There are no activity restrictions related to your D&C other than pelvic rest for 4 weeks (which means no intercourse, tampons or douching for 4 weeks).  NUTRITION  You may resume your normal diet.  Drink 6 to 8 glasses of fluids a day.  Eat a healthy, balanced diet including portions of food from the meat (protein), milk, fruit, vegetable, and bread groups.  Your caregiver may recommend you take a multivitamin with iron.  ELIMINATION   If constipation occurs, drink more liquids, and add more fruits, vegetables, and bran to your diet. You may take a mild laxative, such as Milk of Magnesia, Metamucil, or a stool softener such as Colace,  with permission from your caregiver.  HYGIENE  Avoid placing anything in the vagina for 4 weeks.  It is normal to pass a blood or clear vaginal discharge from the vagina for several weeks after your procedure.  HOME CARE INSTRUCTIONS   Take your temperature twice a day and record it, especially if you feel feverish or have chills.  Follow your caregiver's instructions about medicines, activity, and follow-up appointments after surgery.  Do not drink alcohol while taking pain medicine.  You may take over-the-counter medicine for pain, recommended by your caregiver.  If your pain is not relieved with medicine, call your caregiver.  Do not take aspirin because it can cause bleeding.  Do not douche or use tampons (use a nonperfumed sanitary pad).  Do not have sexual intercourse for 4 weeks postoperatively. Hugging, kissing, and playful sexual activity is fine with your caregiver's permission.  Take showers instead of baths, until your caregiver gives you permission to take baths.  You may take a mild medicine for constipation, recommended by your caregiver. Bran foods and drinking a lot of fluids will help with constipation.  Make sure your family understands everything about your operation and recovery.  SEEK MEDICAL CARE IF:    You notice a foul smell coming from the vagina.  You have painful or bloody urination.  You develop nausea and vomiting.  You develop diarrhea.  You develop a rash.  You have a reaction or allergy from the medicine.  You feel dizzy or light-headed.  You need stronger pain medicine.   SEEK IMMEDIATE MEDICAL CARE IF:   You develop a temperature of 102 F (38.9 C) or higher.  You pass out.  You develop leg or chest pain.  You develop abdominal pain.  You develop shortness of breath.  You bleed heavier than a period (soaking through 2 or more pads per hour for 2 hours in a row).  You see pus in the wound area.  MAKE SURE YOU:    Understand these instructions.  Will watch your condition.  Will get help right away if you are not doing well or get worse.  Dr. [name] at [phone]  Document Released: 03/26/2004 Document Revised: 12/27/2013 Document Reviewed: 07/14/2009 [facility] Patient Information 2015 [place], [place]. This information is not intended to replace advice given to you by your health care provider. Make sure you discuss any questions you have with your health care provider.

## 2018-07-09 ENCOUNTER — Encounter (HOSPITAL_COMMUNITY): Payer: Self-pay | Admitting: General Surgery

## 2018-07-10 ENCOUNTER — Telehealth: Payer: Self-pay | Admitting: Gynecologic Oncology

## 2018-07-10 NOTE — Telephone Encounter (Signed)
Attempted to call patient and check on her current post-op status and to discuss path results.  Left message asking her to please call the office.

## 2018-07-20 ENCOUNTER — Encounter: Payer: Self-pay | Admitting: Oncology

## 2018-07-20 NOTE — Progress Notes (Signed)
Gynecologic Oncology Multi-Disciplinary Disposition Conference Note  Date of the Conference: 07/20/2018  Patient Name: Alexandra Morales  Referring Provider: Primary GYN Oncologist:  Stage/Disposition:  Grade 1 endometrial vs endocervical cancer. Disposition is for pathology to order P16 staining for HPV.  Genetic counseling also recommended.   This Multidisciplinary conference took place involving physicians from [place], [place], Radiation Oncology, Pathology, Radiology along with the Gynecologic Oncology Nurse Practitioner and RN.  Comprehensive assessment of the patient's malignancy, staging, need for surgery, chemotherapy, radiation therapy, and need for further testing were reviewed. Supportive measures, both inpatient and following discharge were also discussed. The recommended plan of care is documented. Greater than 35 minutes were spent correlating and coordinating this patient's care.

## 2018-08-12 ENCOUNTER — Ambulatory Visit: Payer: BLUE CROSS/BLUE SHIELD | Admitting: Family Medicine

## 2018-08-17 ENCOUNTER — Telehealth: Payer: Self-pay | Admitting: *Deleted

## 2018-08-17 NOTE — Telephone Encounter (Signed)
Patient called and rescheduled her appt from 12/27 to 1/6. Patient stated "I am going alright after surgery."

## 2018-08-21 ENCOUNTER — Inpatient Hospital Stay: Payer: BLUE CROSS/BLUE SHIELD | Admitting: Gynecologic Oncology

## 2018-08-24 IMAGING — CT CT FEMUR *L* W/ CM
3 of 5 series · 16 of 34 positions shown, 18 images · IV contrast (agent unspecified)
Comparison: None.

CONTRAST:  100 ml OMNIPAQUE IOHEXOL 300 MG/ML  SOLN

CLINICAL DATA: Slowly growing mass lesion in the upper left thigh.

EXAM:
CT OF THE LOWER RIGHT EXTREMITY WITH CONTRAST
TECHNIQUE: Multidetector CT imaging of the lower right extremity was performed
according to the standard protocol following intravenous contrast
administration.

[Series 9: lower ext cor st · coronal · 1.01mm/px · 3 of 281 slices shown]
[im 27/281  bone]
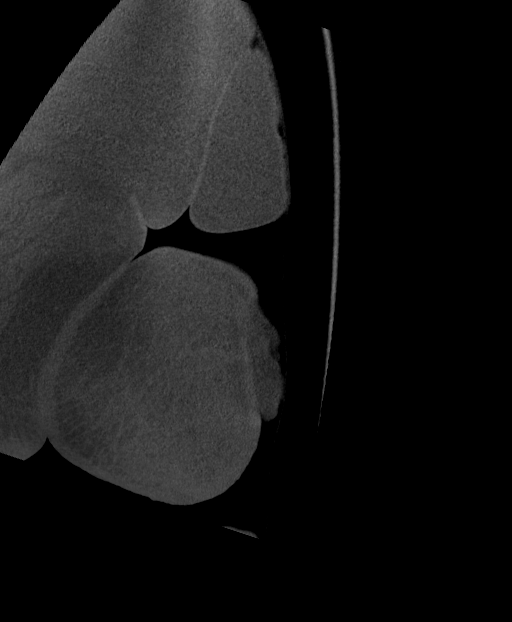
[im 95/281  bone]
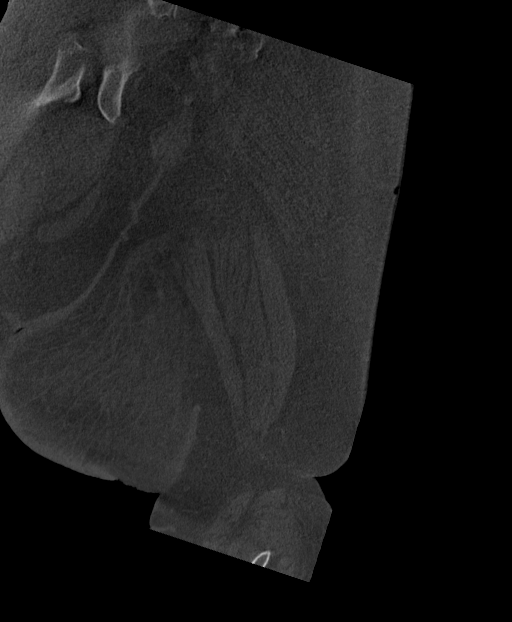
[im 164/281  bone]
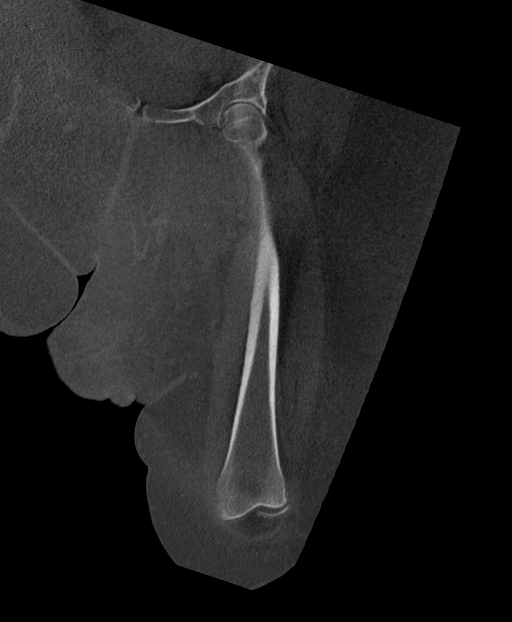

[Series 11: lower ext 3 (id) (person_name) · axial · 0.98mm/px · z∈[+777,+1194]mm · 8 of 330 slices shown, 10 images]
[im 26/330  soft-tissue]
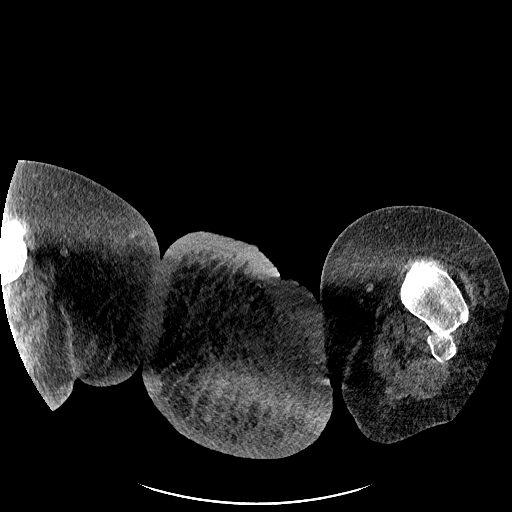
[im 26/330  bone]
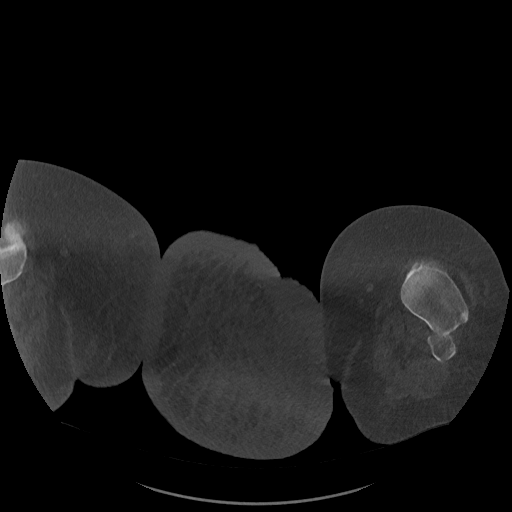
[im 76/330  bone]
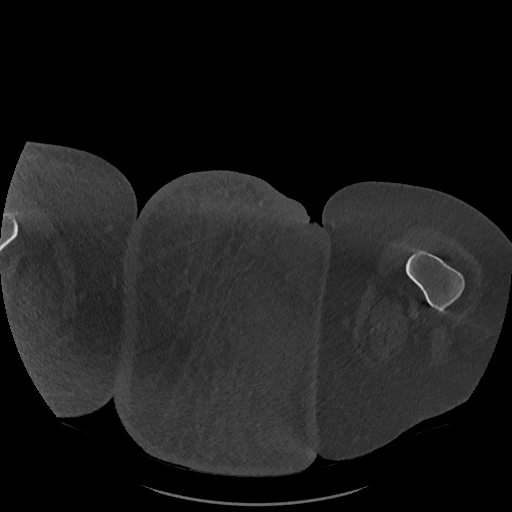
[im 102/330  bone]
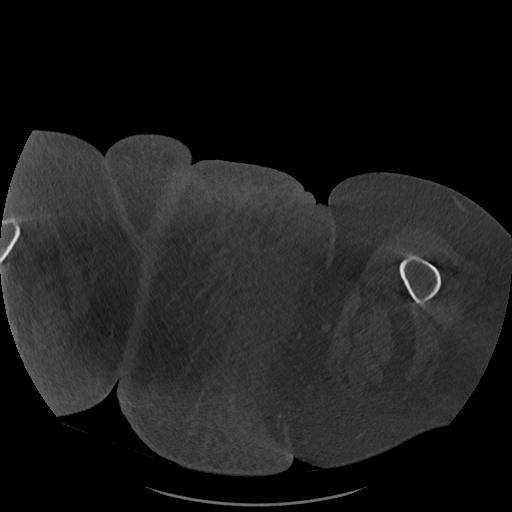
[im 152/330  bone]
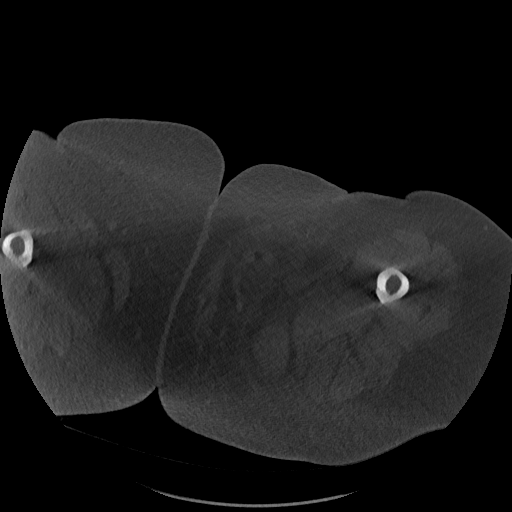
[im 178/330  soft-tissue]
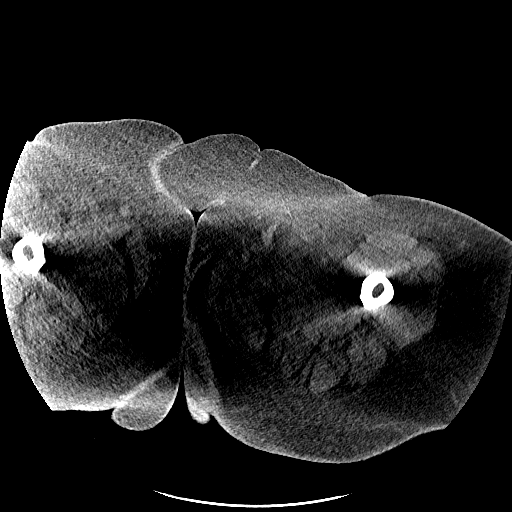
[im 178/330  bone]
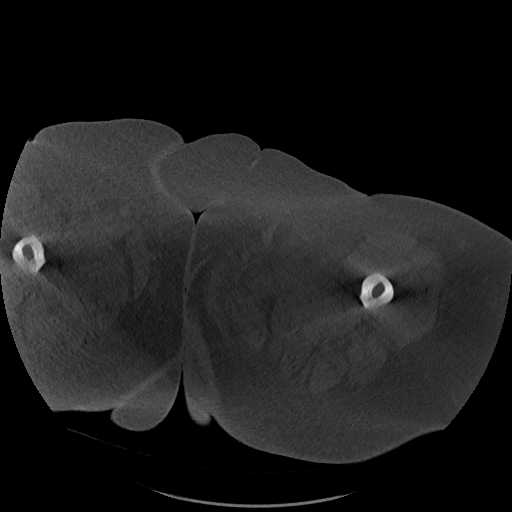
[im 228/330  bone]
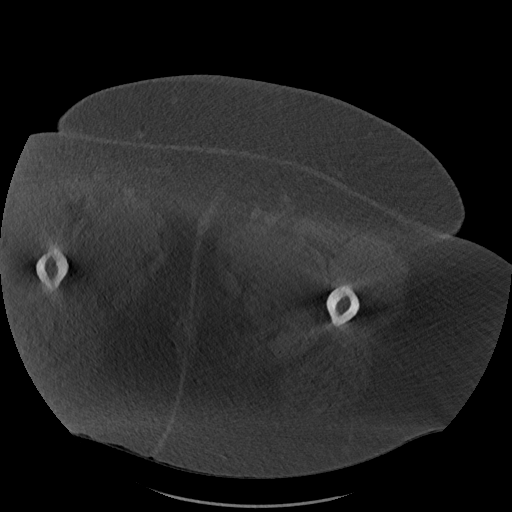
[im 254/330  bone]
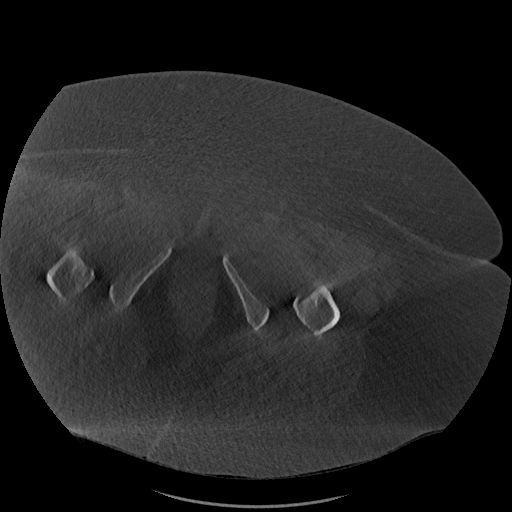
[im 304/330  bone]
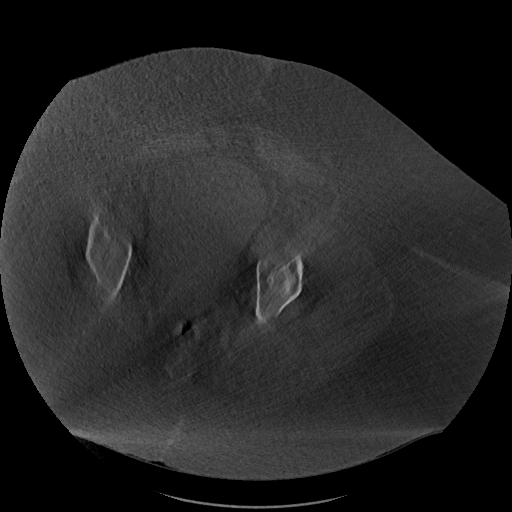

[Series 13: lower ext sag bone · sagittal · 0.94mm/px · 5 of 208 slices shown]
[im 40/208  bone]
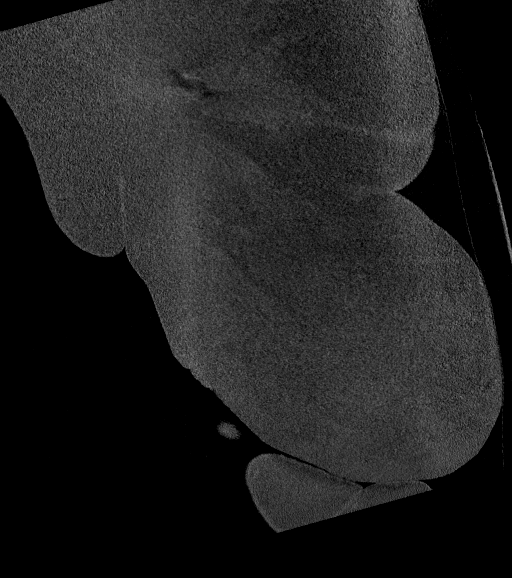
[im 78/208  bone]
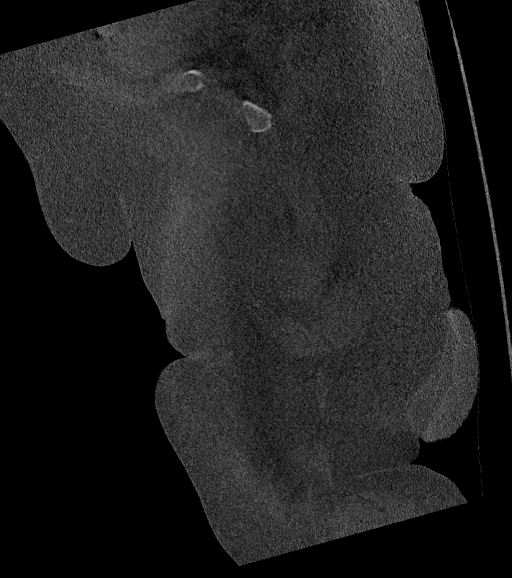
[im 89/208  soft-tissue]
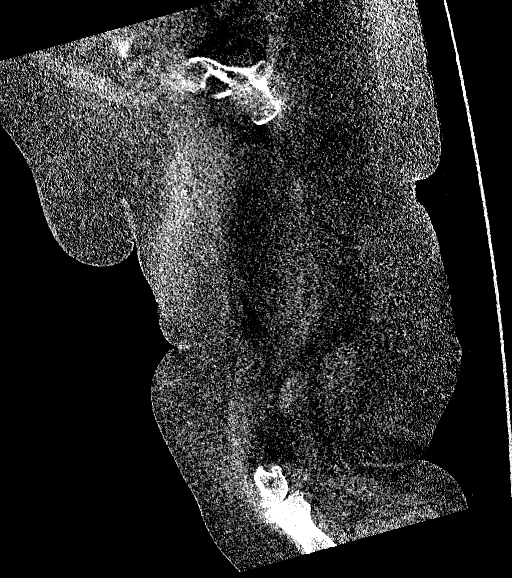
[im 117/208  bone]
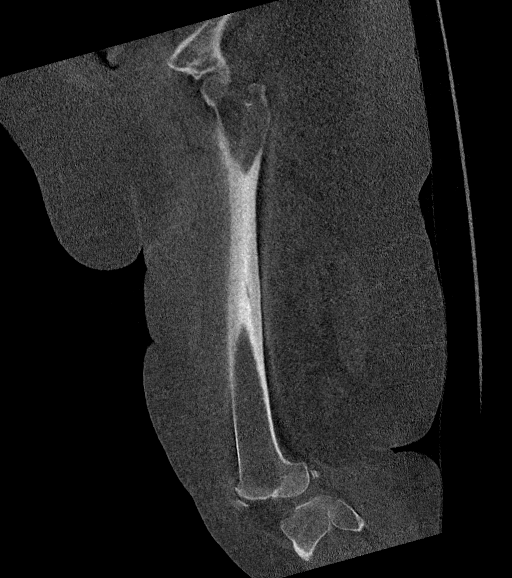
[im 156/208  bone]
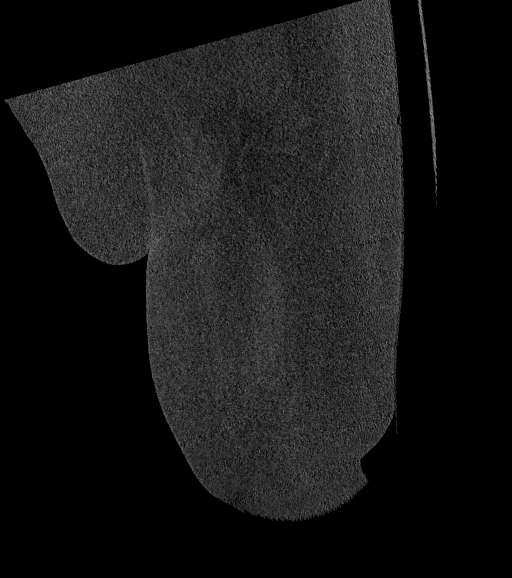

[16 of 34 positions shown; findings below may reference images not displayed]

FINDINGS: This exam is limited by the patient's body habitus.

Bones/Joint/Cartilage

No acute or focal abnormality is identified. Degenerative change is
seen about the knee and most notable in medial compartment.

Ligaments

Suboptimally assessed by CT.

Muscles and Tendons

Intact.  There is some fatty atrophy of all visualized musculature.

Soft tissues

There is a mass lesion in the medial aspect of the left upper leg
measuring 30 cm AP x 20 cm transverse x 30 cm craniocaudal. The
lesion is fat attenuating with extensive stranding throughout it.
Cutaneous tissues about the mass are thickened. No focal fluid
collection is identified.
IMPRESSION: Fatty lesion in the medial aspect of the left thigh is most
consistent with a lipoma. Extensive stranding throughout lipoma
could be due to cellulitis or necrosis.

## 2018-08-27 ENCOUNTER — Ambulatory Visit: Payer: BLUE CROSS/BLUE SHIELD | Admitting: Family Medicine

## 2018-08-31 ENCOUNTER — Inpatient Hospital Stay: Payer: BLUE CROSS/BLUE SHIELD | Attending: Gynecologic Oncology | Admitting: Gynecologic Oncology

## 2018-08-31 ENCOUNTER — Encounter: Payer: Self-pay | Admitting: Gynecologic Oncology

## 2018-08-31 VITALS — BP 178/100 | HR 90 | Temp 98.7°F | Resp 18 | Ht 67.0 in | Wt >= 6400 oz

## 2018-08-31 DIAGNOSIS — R42 Dizziness and giddiness: Secondary | ICD-10-CM | POA: Diagnosis not present

## 2018-08-31 DIAGNOSIS — L03116 Cellulitis of left lower limb: Secondary | ICD-10-CM | POA: Diagnosis not present

## 2018-08-31 DIAGNOSIS — M79662 Pain in left lower leg: Secondary | ICD-10-CM | POA: Diagnosis not present

## 2018-08-31 DIAGNOSIS — Z79818 Long term (current) use of other agents affecting estrogen receptors and estrogen levels: Secondary | ICD-10-CM | POA: Diagnosis not present

## 2018-08-31 DIAGNOSIS — I10 Essential (primary) hypertension: Secondary | ICD-10-CM | POA: Diagnosis not present

## 2018-08-31 DIAGNOSIS — C541 Malignant neoplasm of endometrium: Secondary | ICD-10-CM | POA: Diagnosis not present

## 2018-08-31 DIAGNOSIS — Z975 Presence of (intrauterine) contraceptive device: Secondary | ICD-10-CM | POA: Diagnosis not present

## 2018-08-31 DIAGNOSIS — R51 Headache: Secondary | ICD-10-CM | POA: Diagnosis not present

## 2018-08-31 DIAGNOSIS — R2242 Localized swelling, mass and lump, left lower limb: Secondary | ICD-10-CM | POA: Diagnosis not present

## 2018-08-31 DIAGNOSIS — M25572 Pain in left ankle and joints of left foot: Secondary | ICD-10-CM | POA: Diagnosis not present

## 2018-08-31 NOTE — Progress Notes (Signed)
Follow-up Note: Gyn-Onc   Alexandra Morales 47 y.o. female  Chief Complaint  Patient presents with  . Endometrial cancer [facility])     Assessment : grade 1 endometrial cancer, not an operative candidate at this time due to weight. Treated with progestin releaseing IUD placed November, 2019.  Bleeding well controlled. Successful (incomplete) efforts at weight loss.   Plan:  Will see her back in 3 months. May attempt pipelle (would need to be blindly performed). If no bleeding, can be deferred.  She will follow-up with Dr Nori Riis to discuss her severely high BP's.   I explained that hysterectomy might be possible if she were to have more significant weight loss (< 400lbs total) and her underlying medical comorbdities (such as HTN) are controlled.   Interval history:  On 07/07/18 she underwent joint procedure with Dr. Denman George and Dr. Rosendo Gros.  Dr. Denman George performed a D&C, and Dr. Rosendo Gros performed an excision of a lipomatous tumor on the left thigh.  At the time of the [facility] progestin releasing IUD was placed.  Final pathology revealed scant highly atypical glandular epithelium (P 16 was notable to be performed due to the insufficiency of the atypical cells).  CIN-1 was noted.  There was scant inactive endometrium with progestin effect noted.  After the procedure she continued to have some intermittent vaginal bleeding, however this stopped with the use of Megace.  She stopped taking the Megace and this completely resolved.  Since her surgical date she has lost 27 pounds. 11 lbs of this was from the lipomatous tumor.  She developed wound separation at the left thigh and is performing wet to dry dressings.  HPI: 48 year old white female who was initially seen in consultation at the request of Dr. Delsa Sale regarding management of a newly diagnosed grade 1 endometrial carcinoma. The patient presented with abnormal uterine bleeding and underwent a D&C and insertion of a Mirena IUD on 08/22/2014.     The patient's primary problem is that she is morbidly obese weighing 471 pounds. She denies any GI or GU symptoms has no pelvic pain pressure and is only having some vaginal spotting.  She did well until early 2019 when she began to have vaginal bleeding and a hemoglobin of 4.4 necessitating 4 units of blood transfusion.  In addition to the Mirena IUD she was placed on Megace 40 mg 4 times daily which resulted in stoppage of the bleeding.  Patient returned in July, 2019 after having last been seen by our office in 2016.  She has had a Mirena IUD since December 2015 and has done well with that until proximally 3 months ago when she started having vaginal bleeding.  Apparently the bleeding got heavier and the patient was recently admitted.  She was placed on Megace 40 mg 4 times daily and the bleeding stopped.   Patient had an ultrasound recently but because of her habitus was inconclusive and endometrial stripe cannot be measured accurately. The uterine size was 12cm.  She began experiencing bleeding again in July, 2019. This stopped in August, 2019.  She had an MRI ordered to evaluate the uterine lining, however, despite ordering an open MRI and prescribing Ativan, she did not consent to the scan and declined it due to anxiety/claustraphobia.  Review of Systems:10 point review of systems is negative except as noted in interval history.   Vitals: Blood pressure (!) 178/100, pulse 90, temperature 98.7 F (37.1 C), temperature source Oral, resp. rate 18, height 5\' 7"  (1.702 m), weight Marland Kitchen)  447 lb (202.8 kg), SpO2 98 %.  Physical Exam: Gen: in no distress Abd: extreme morbid obesity with large pannus and mons pannus. No masses. Pelvic: unable to visualize cervix in office with office equipment due to body habitus. Normal distal vagina. Cervix palpably normal with IUD strings palpable in place. No blood. Peripheries: peau d'orange changes to lower legs with venous stasis and erythema. Dressing  applied to left thigh.    Allergies  Allergen Reactions  . Tape Rash and Other (See Comments)    Paper tape is tolerated best, allergic to adhesive tape.    Past Medical History:  Diagnosis Date  . Anemia   . Cancer ([place])   . Endometrial cancer ([place]) 09/01/2014  . Family history of adverse reaction to anesthesia    Aunt-PONV  . History of blood transfusion 04/26/2014   [facility] - 5 Units transfused  . Hypertension   . PCOS (polycystic ovarian syndrome)    tx with metformin    Past Surgical History:  Procedure Laterality Date  . CHOLECYSTECTOMY    . CORNEAL TRANSPLANT Right   . DILATION AND CURETTAGE OF UTERUS  07/07/2018  . DILATION AND CURETTAGE OF UTERUS N/A 07/07/2018   Procedure: DILATATION AND CURETTAGE;  Surgeon: [name], MD;  Location: [facility];  Service: Gynecology;  Laterality: N/A;  . EXCISION MASS LOWER EXTREMETIES Left 07/07/2018    EXCISION OF LOWER LEFT EXTREMITY MASS (Left Leg Upper)  . EXCISION OF KELOID Left 07/07/2018   Procedure: EXCISION OF LOWER LEFT EXTREMITY MASS;  Surgeon: Ralene Ok, MD;  Location: [facility];  Service: General;  Laterality: Left;  . HYSTEROSCOPY W/D&C  08/22/2014   Procedure: DILATATION AND CURETTAGE /HYSTEROSCOPY, Exam Under Anesthesia, Pap Smear, Mirena Insertion;  Surgeon: Lahoma Crocker, MD;  Location: [facility];  Service: Gynecology;;  . INTRAUTERINE DEVICE (IUD) INSERTION  07/07/2018   Mirena  . INTRAUTERINE DEVICE (IUD) INSERTION N/A 07/07/2018   Procedure: INTRAUTERINE DEVICE (IUD) INSERTION, MIRENA;  Surgeon: [name], MD;  Location: [facility];  Service: Gynecology;  Laterality: N/A;  . WISDOM TOOTH EXTRACTION      Current Outpatient Medications  Medication Sig Dispense Refill  . ferrous sulfate 325 (65 FE) MG tablet Take 325 mg by mouth 3 (three) times daily with meals.    Marland Kitchen FLUoxetine (PROZAC) 20 MG capsule Take 40 mg by mouth daily.  1  . FLUoxetine (PROZAC) 40 MG capsule Take 1 capsule (40 mg total) by mouth daily. 90  capsule 3  . ibuprofen (ADVIL,MOTRIN) 200 MG tablet Take 600 mg by mouth every 8 (eight) hours as needed (for pain.).    Marland Kitchen indapamide (LOZOL) 2.5 MG tablet Take 2 tabs by mouth daily (Patient taking differently: Take 5 mg by mouth at bedtime. ) 180 tablet 3  . megestrol (MEGACE) 40 MG tablet Take 1 tablet (40 mg total) by mouth 2 (two) times daily. Can increase to two tablets twice a day in the event of heavy bleeding (Patient taking differently: Take 40-80 mg by mouth 2 (two) times daily as needed (heavy bleeding.). ) 60 tablet 6  . levonorgestrel (MIRENA) 20 MCG/24HR IUD 1 Intra Uterine Device (1 each total) by Intrauterine route once for 1 dose. For OR procedure on 11/12 1 each 0   No current facility-administered medications for this visit.     Social History   Socioeconomic History  . Marital status: Single    Spouse name: Not on file  . Number of children: Not on file  . Years  of education: Not on file  . Highest education level: Not on file  Occupational History  . Not on file  Social Needs  . Financial resource strain: Not on file  . Food insecurity:    Worry: Not on file    Inability: Not on file  . Transportation needs:    Medical: Not on file    Non-medical: Not on file  Tobacco Use  . Smoking status: Never Smoker  . Smokeless tobacco: Never Used  Substance and Sexual Activity  . Alcohol use: Yes    Alcohol/week: 0.0 standard drinks    Comment: occasionally   . Drug use: No  . Sexual activity: Not Currently    Birth control/protection: I.U.D.  Lifestyle  . Physical activity:    Days per week: Not on file    Minutes per session: Not on file  . Stress: Not on file  Relationships  . Social connections:    Talks on phone: Not on file    Gets together: Not on file    Attends religious service: Not on file    Active member of club or organization: Not on file    Attends meetings of clubs or organizations: Not on file    Relationship status: Not on file  .  Intimate partner violence:    Fear of current or ex partner: Not on file    Emotionally abused: Not on file    Physically abused: Not on file    Forced sexual activity: Not on file  Other Topics Concern  . Not on file  Social History Narrative   Lives in [place].    Co-manager at [employer]    Family History  Problem Relation Age of Onset  . Cancer Mother   . Breast cancer Mother       Thereasa Solo, MD 08/31/2018, 11:26 AM

## 2018-08-31 NOTE — Patient Instructions (Signed)
Dr Denman George recommends using megace if you develop break through bleeding on the mirena IUD.  She will see you back in 10months to follow-up your endometrial cancer symptoms.  She will consider you for surgery if your weight has decreased to below 400lbs and your medical conditions have improved.

## 2018-09-01 DIAGNOSIS — I4589 Other specified conduction disorders: Secondary | ICD-10-CM | POA: Diagnosis not present

## 2018-09-23 ENCOUNTER — Encounter: Payer: Self-pay | Admitting: Family Medicine

## 2018-09-25 ENCOUNTER — Other Ambulatory Visit: Payer: Self-pay | Admitting: Family Medicine

## 2018-09-25 MED ORDER — LISINOPRIL 40 MG PO TABS: 40.0000 mg | ORAL_TABLET | Freq: Every day | ORAL | 3 refills | Status: DC

## 2018-09-30 ENCOUNTER — Ambulatory Visit: Payer: BLUE CROSS/BLUE SHIELD | Admitting: Family Medicine

## 2018-10-28 ENCOUNTER — Ambulatory Visit: Payer: BLUE CROSS/BLUE SHIELD | Admitting: Family Medicine

## 2018-11-20 ENCOUNTER — Telehealth: Payer: Self-pay | Admitting: *Deleted

## 2018-11-20 NOTE — Telephone Encounter (Signed)
Called and left the patient a message to the call the office back. Need to move her appt from 4/16 to June.

## 2018-11-23 ENCOUNTER — Telehealth: Payer: Self-pay | Admitting: *Deleted

## 2018-11-23 NOTE — Telephone Encounter (Signed)
Called and left the patient a message to call the office back. Need to reschedule her appt on 4/17 out several weeks due to COVID-19

## 2018-11-24 ENCOUNTER — Telehealth: Payer: Self-pay | Admitting: *Deleted

## 2018-11-24 NOTE — Telephone Encounter (Signed)
Called and left the patient a message to call the office back. Need to cancel the appt on 4/16 and reschedule several weeks out due to COVID-19

## 2018-11-25 ENCOUNTER — Ambulatory Visit: Payer: BLUE CROSS/BLUE SHIELD | Admitting: Family Medicine

## 2018-11-25 ENCOUNTER — Telehealth: Payer: Self-pay | Admitting: *Deleted

## 2018-11-25 NOTE — Telephone Encounter (Signed)
Patient called back, appt rescheduled to 6/3 due to COVID-19

## 2018-11-26 IMAGING — US IR BIOPSY CORE MUSCLE/SOFT TISSUE
1 series · 13 of 15 positions shown · non-contrast
Comparison: none

INDICATION: 46-year-old with a massive subcutaneous fatty lesion involving the
medial aspect of the left thigh. Request for tissue sampling.

[Series 1: ir biopsy core muscle/soft tissue · 13 of 15 slices shown]
[im 1/15]
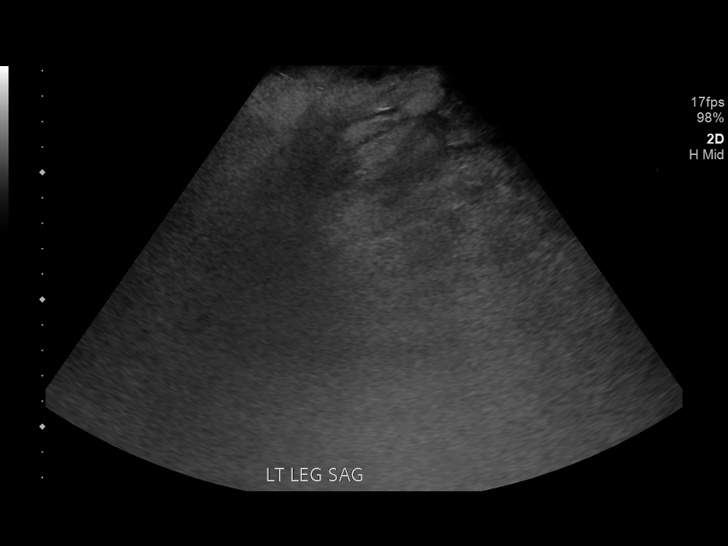
[im 2/15]
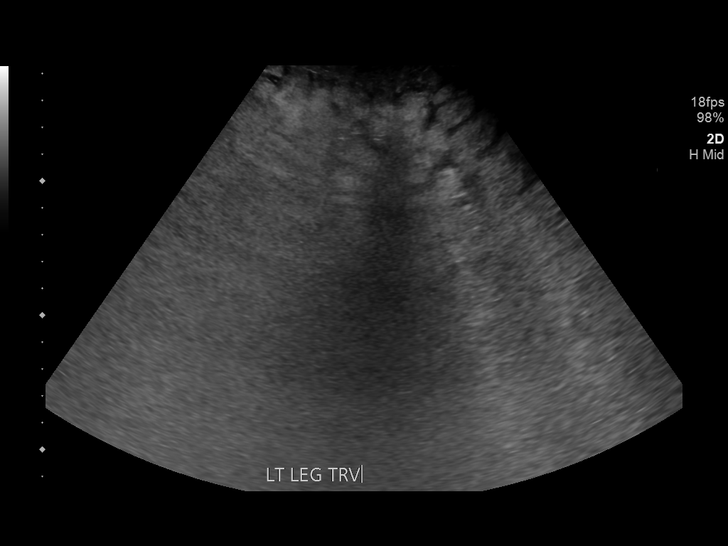
[im 3/15]
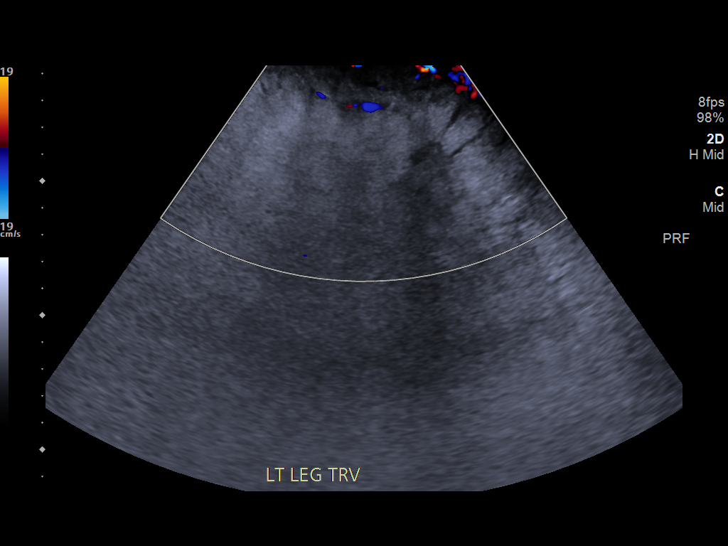
[im 5/15]
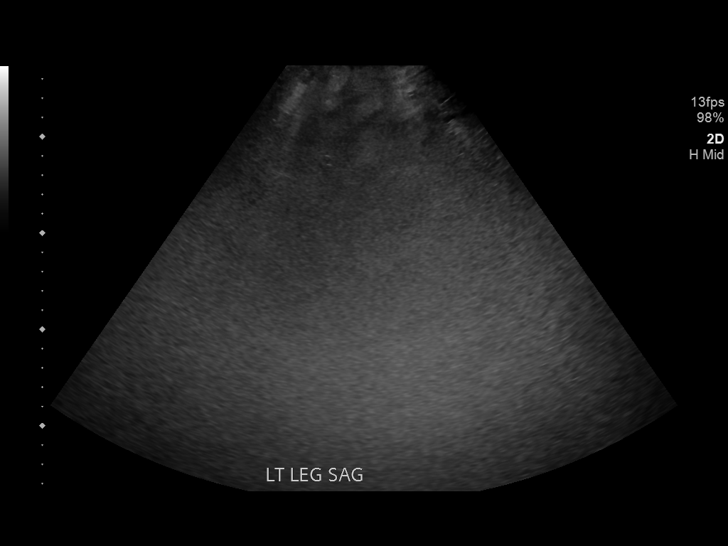
[im 6/15]
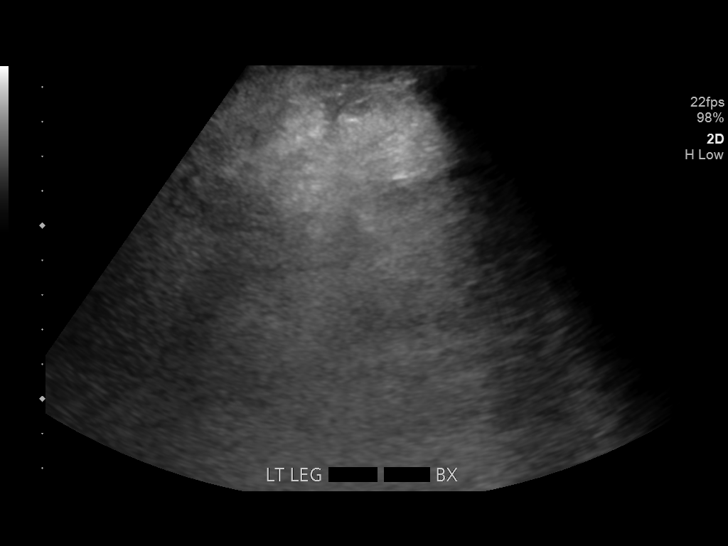
[im 7/15]
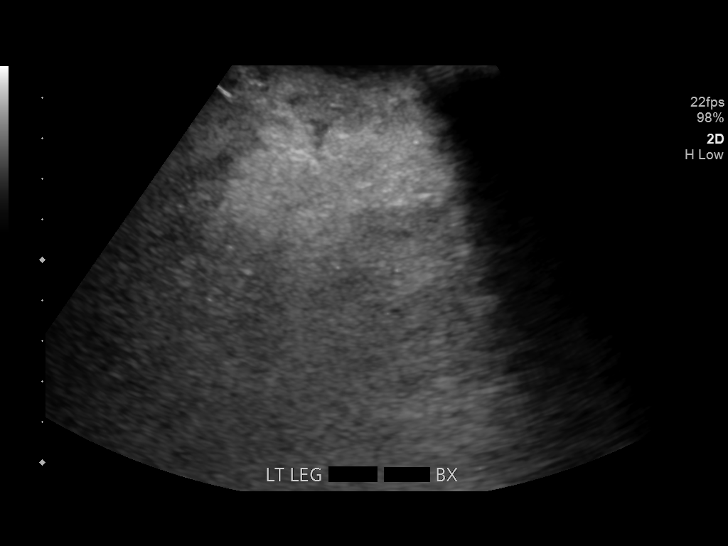
[im 8/15]
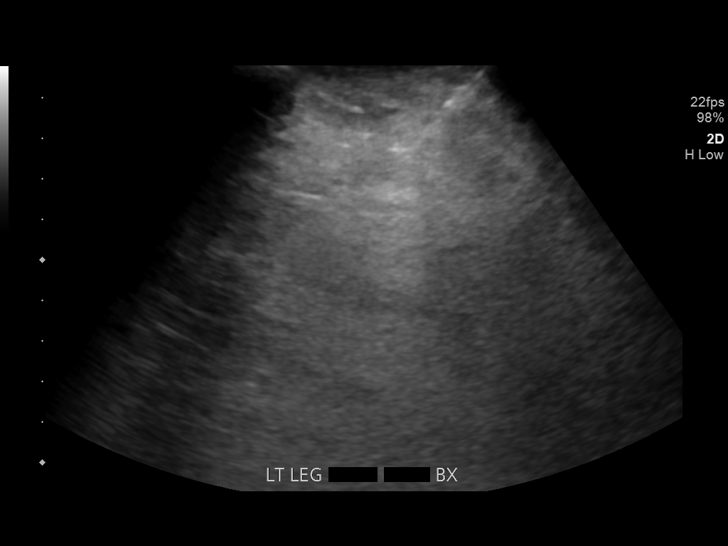
[im 9/15]
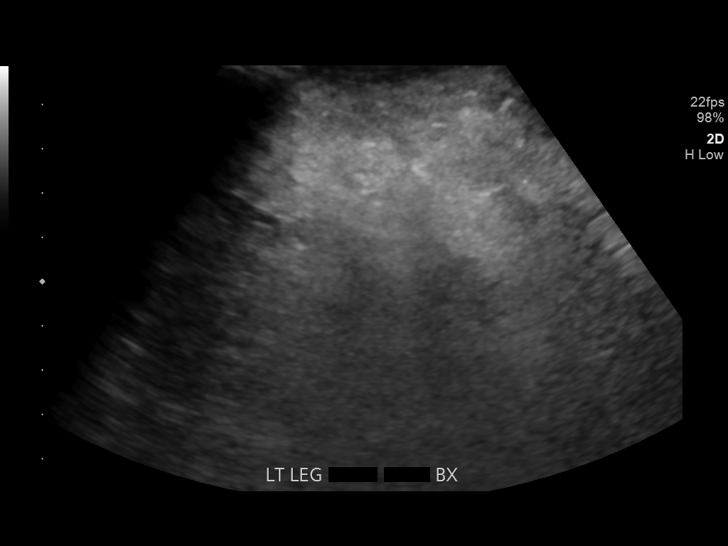
[im 10/15]
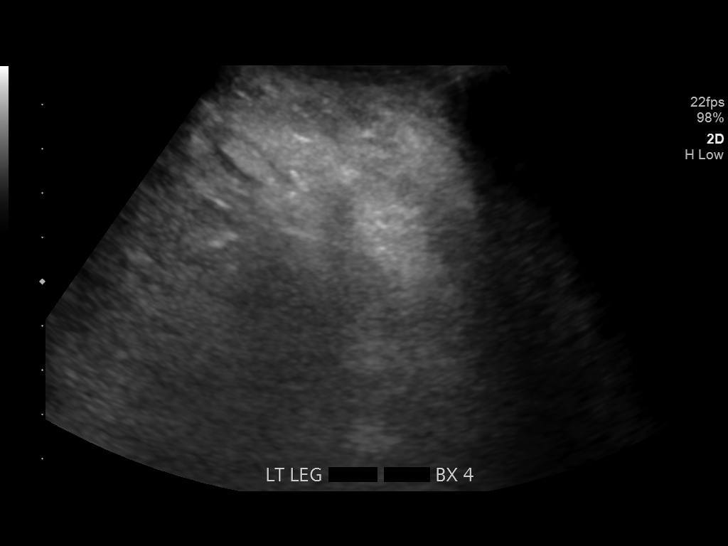
[im 11/15]
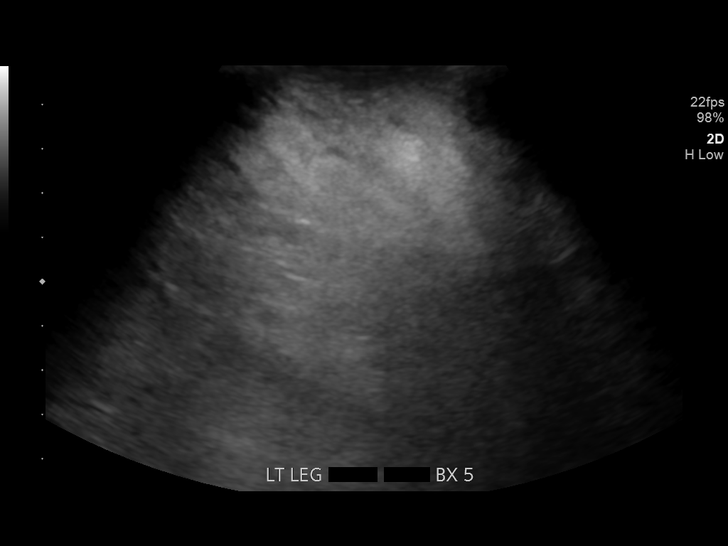
[im 13/15]
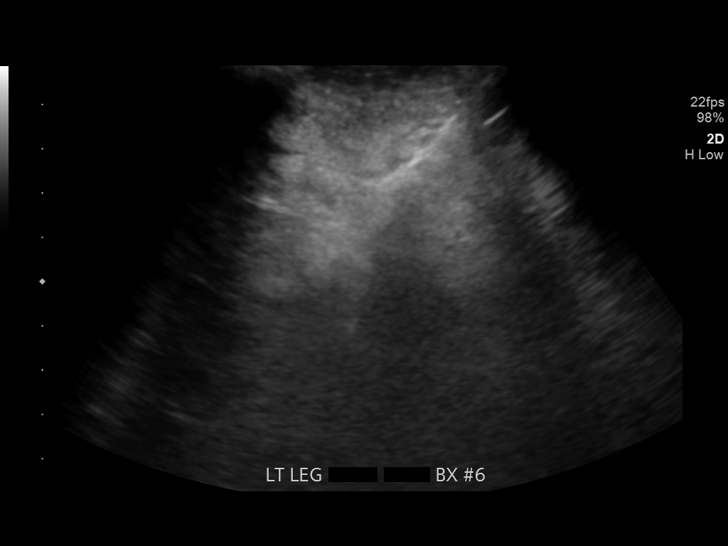
[im 14/15]
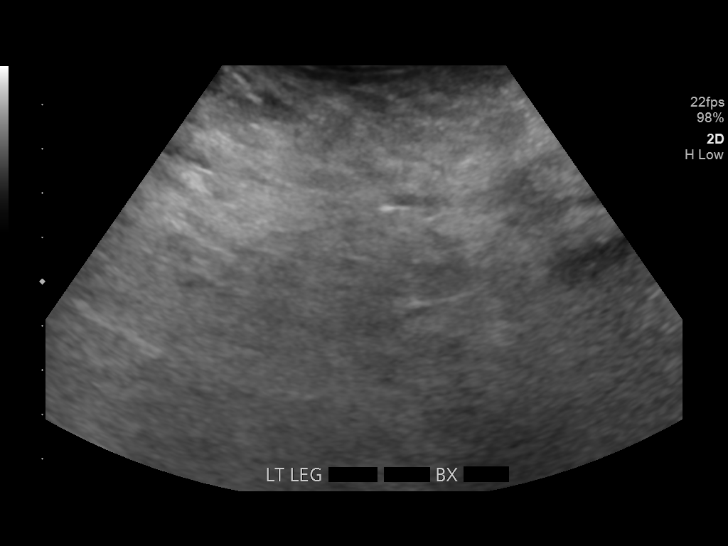
[im 15/15]
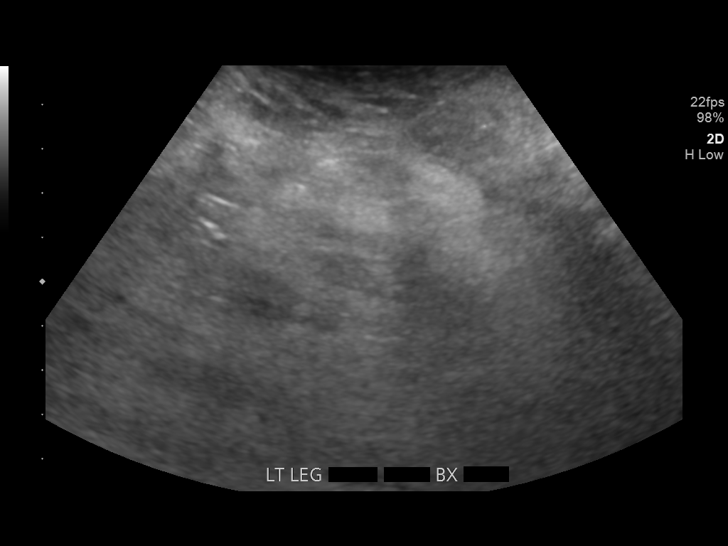

[13 of 15 positions shown; findings below may reference images not displayed]

EXAM:
ULTRASOUND-GUIDED CORE BIOPSY OF LEFT THIGH SUBCUTANEOUS LESION

MEDICATIONS:
None.

ANESTHESIA/SEDATION:
None

FLUOROSCOPY TIME:  None

COMPLICATIONS:
None immediate.

PROCEDURE:
Informed written consent was obtained from the patient after a
thorough discussion of the procedural risks, benefits and
alternatives. All questions were addressed. A timeout was performed
prior to the initiation of the procedure.

There is a massive soft tissue lesion involving the medial aspect of
left thigh. Ultrasound demonstrated extensive fatty tissue
throughout the lesion. Anterior aspect of the lesion was prepped
with chlorhexidine and sterile field was created. Skin was
anesthetized with 1% lidocaine. 17 gauge coaxial needle was directed
into the lesion with ultrasound guidance. A total of 6 core biopsies
were obtained with 18 gauge core device. Specimens placed in
formalin. Bandage placed over the puncture site.
FINDINGS: Massive subcutaneous fatty lesion involving the medial aspect of the
left thigh. Six core biopsies were obtained.
IMPRESSION: Ultrasound-guided core biopsies of the subcutaneous fatty lesion in
the medial left thigh.

## 2018-12-05 IMAGING — US US PELVIS COMPLETE TRANSABD/TRANSVAG
1 series · 13 of 25 positions shown · non-contrast
Comparison: None

CLINICAL DATA: Uterine bleeding.  Dysfunctional uterine bleeding.



[Series 1: us pelvis complete transabd/transvag · 0.30mm/px · 13 of 56 slices shown]
[im 1/56]
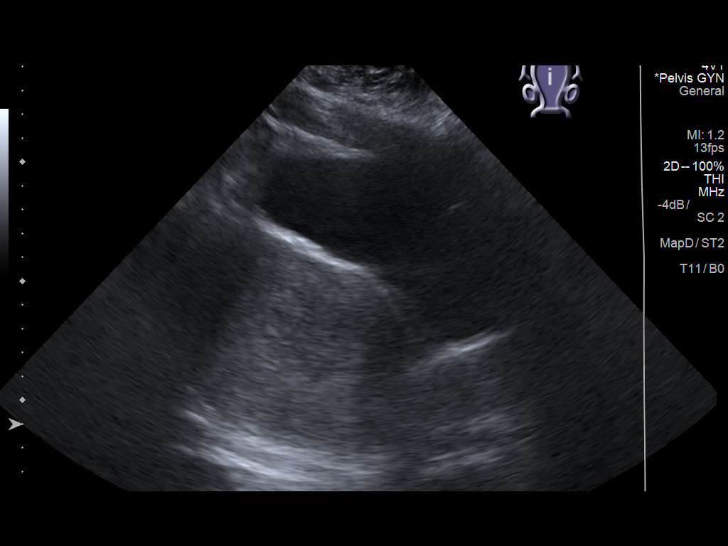
[im 5/56]
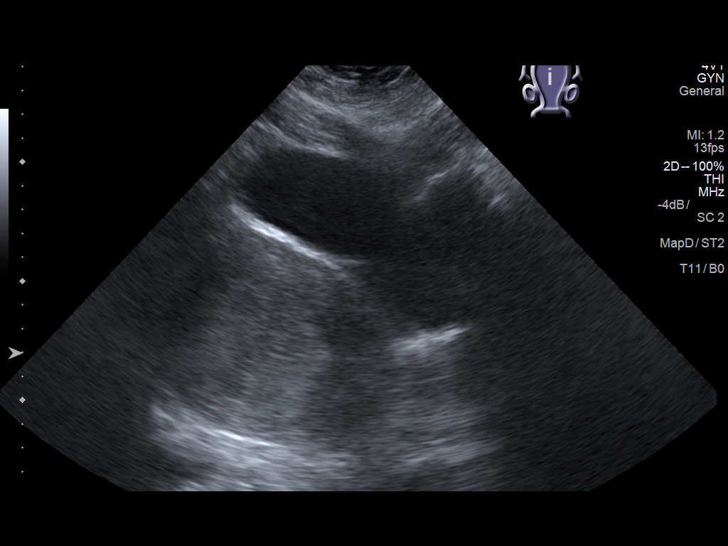
[im 10/56]
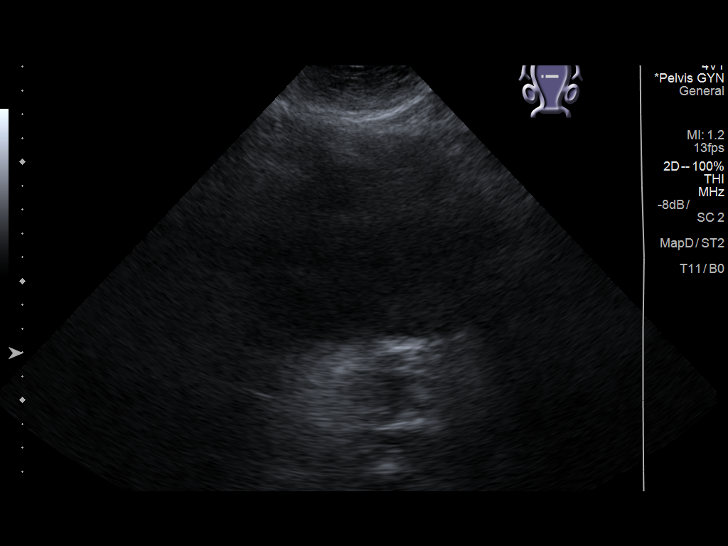
[im 14/56]
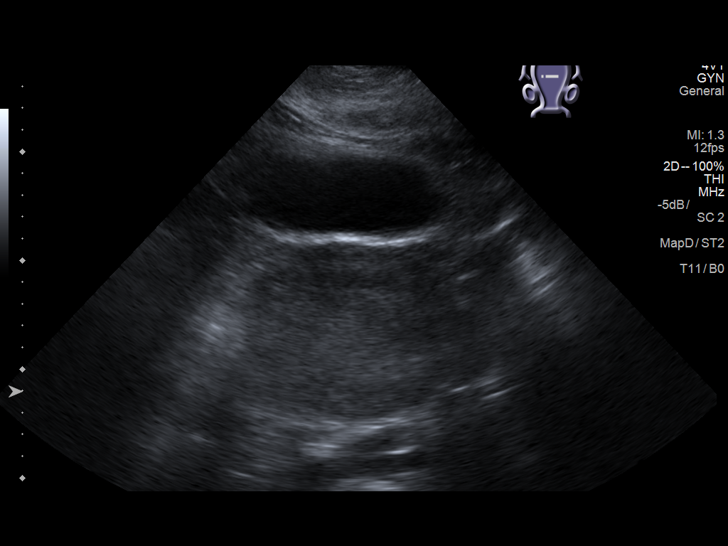
[im 19/56]
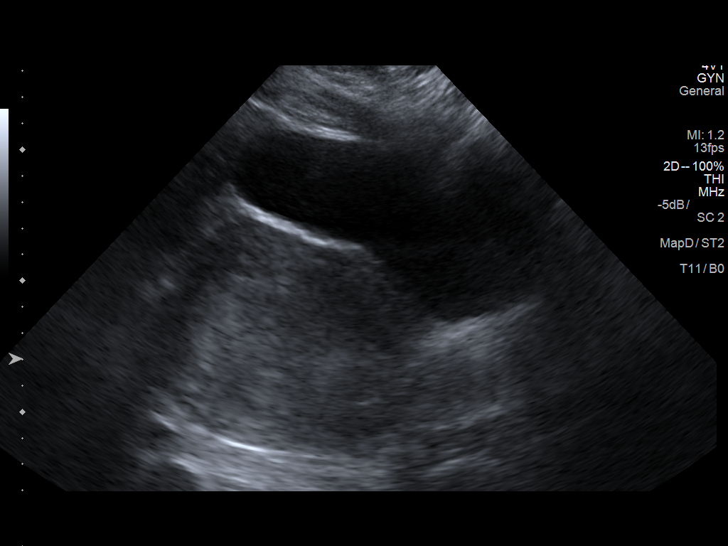
[im 23/56]
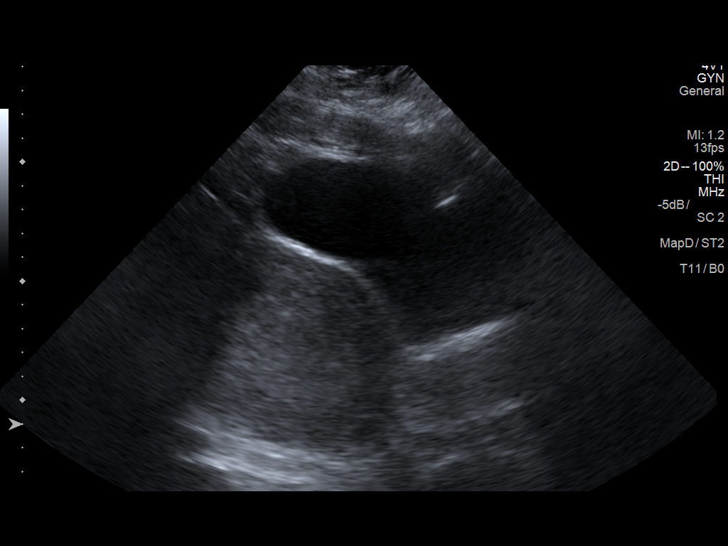
[im 28/56]
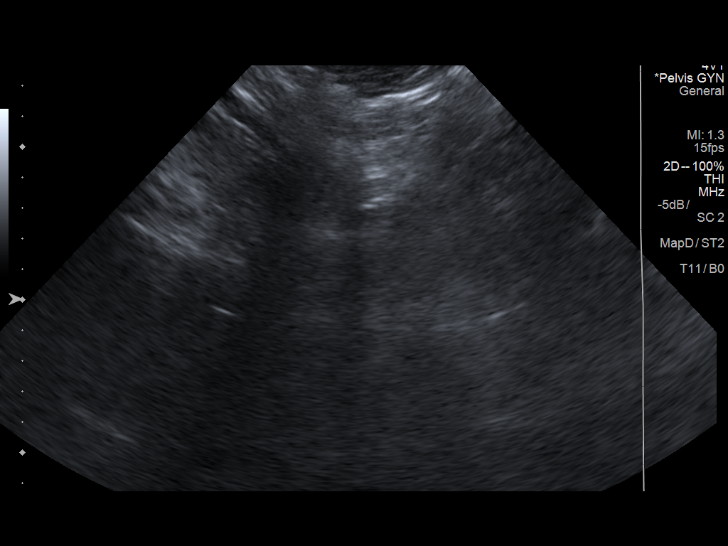
[im 33/56]
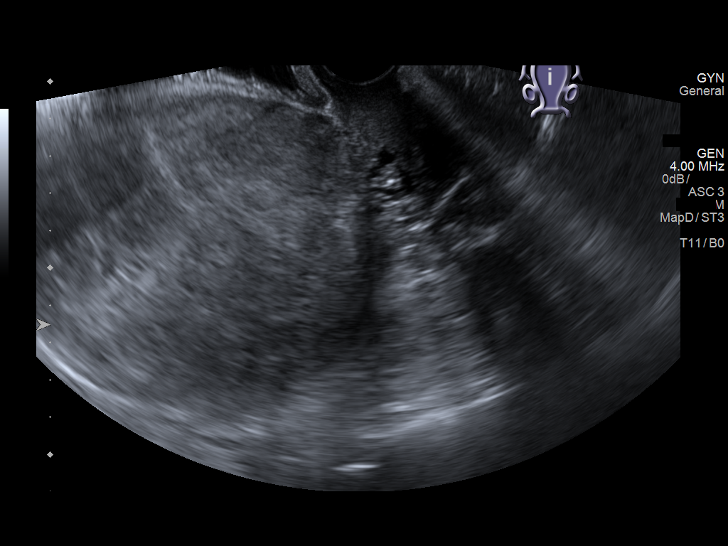
[im 37/56]
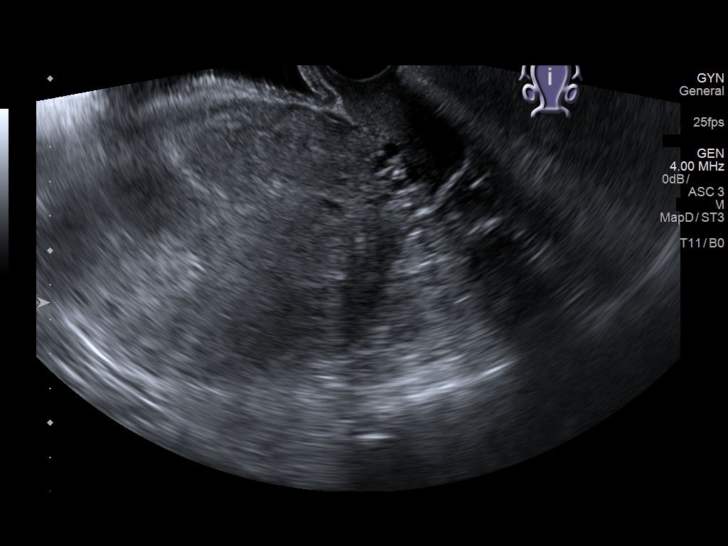
[im 42/56]
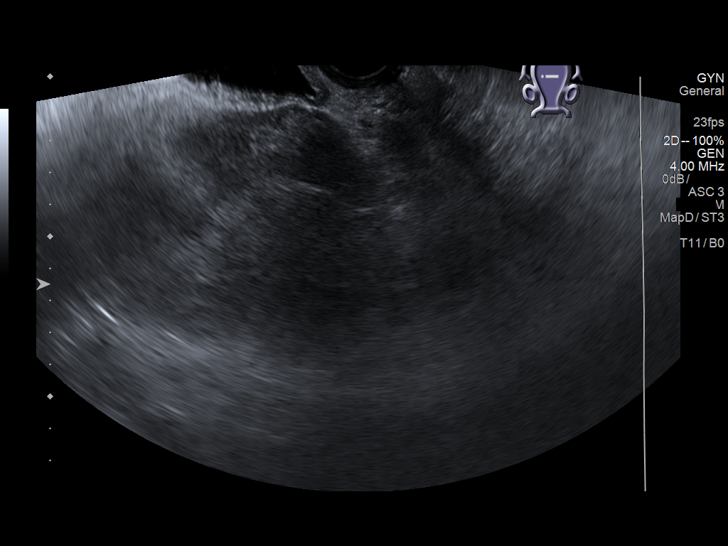
[im 46/56]
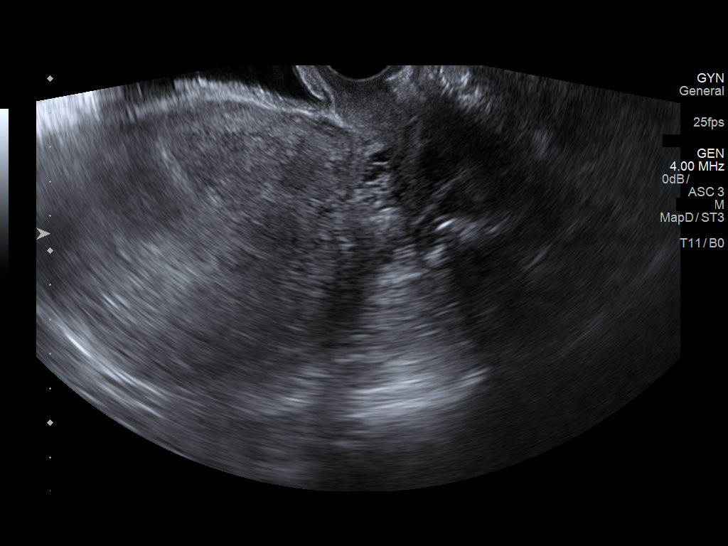
[im 51/56]
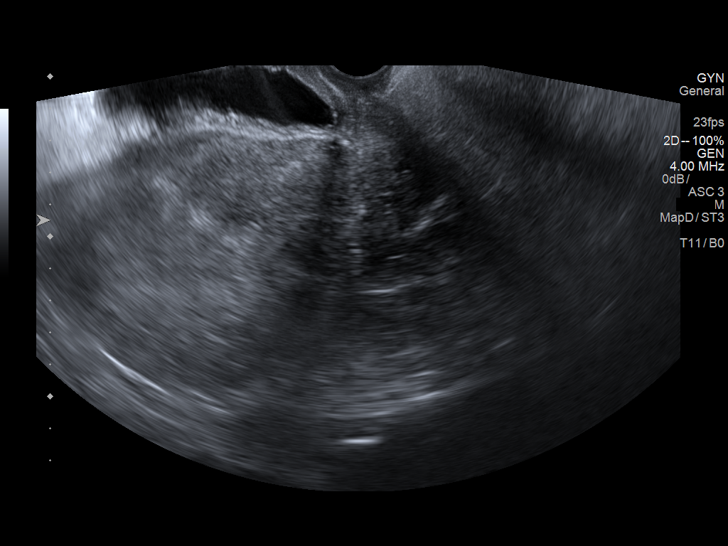
[im 56/56]
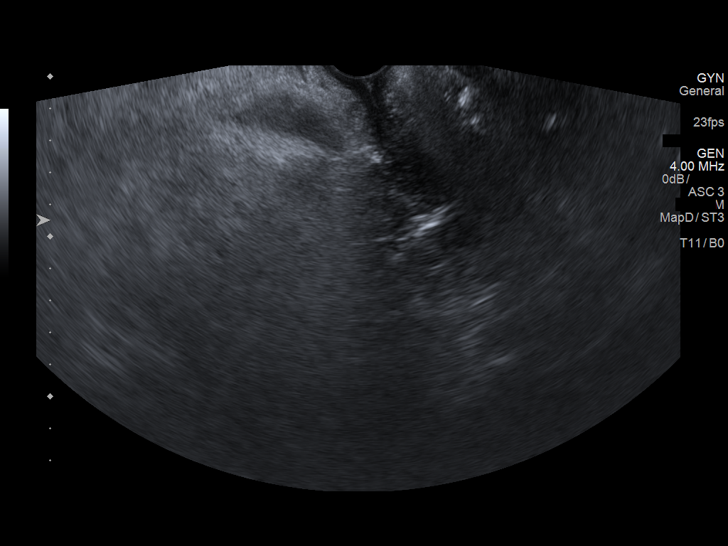

[13 of 25 positions shown; findings below may reference images not displayed]

FINDINGS: Uterus

Measurements: 13.2 x 8.6 x 9.9 cm.. Heterogeneous echotexture. No
mass.

Endometrium

Thickness: Endometrial stripe is difficult to measure.. Endometrial
stripe not clearly identified transabdominal or transvaginal.

Right ovary

Measurements: Not identified.

Left ovary

Measurements: Not identified.

Other findings

No abnormal free fluid.
IMPRESSION: 1. Exam limited in part due to patient body habitus.
2. The endometrial stripe is not confidently measurable on
transabdominal or transvaginal imaging. Cannot exclude expansion of
the endometrium in patient with uterine bleeding. Recommend non
emergent OBGYN consultation with potential sonohysterogram.
3. Ovaries not identified as above.

## 2018-12-10 ENCOUNTER — Ambulatory Visit: Payer: BLUE CROSS/BLUE SHIELD | Admitting: Gynecologic Oncology

## 2018-12-15 ENCOUNTER — Encounter: Payer: Self-pay | Admitting: Family Medicine

## 2018-12-18 ENCOUNTER — Encounter: Payer: Self-pay | Admitting: Family Medicine

## 2018-12-18 MED ORDER — BUPROPION HCL ER (XL) 150 MG PO TB24: 150.0000 mg | ORAL_TABLET | Freq: Every day | ORAL | 1 refills | Status: DC

## 2019-01-29 ENCOUNTER — Telehealth: Payer: Self-pay | Admitting: *Deleted

## 2019-01-29 NOTE — Telephone Encounter (Signed)
Returned the patient's call and left her a message with the new appt. Moved appt from this week to Sept. Explained to call the office with any issues/concerns

## 2019-02-05 ENCOUNTER — Ambulatory Visit: Payer: BLUE CROSS/BLUE SHIELD | Admitting: Gynecologic Oncology

## 2019-02-10 ENCOUNTER — Other Ambulatory Visit: Payer: Self-pay | Admitting: Family Medicine

## 2019-02-10 ENCOUNTER — Encounter: Payer: Self-pay | Admitting: Family Medicine

## 2019-02-10 MED ORDER — BUPROPION HCL ER (XL) 150 MG PO TB24: 150.0000 mg | ORAL_TABLET | Freq: Every day | ORAL | 1 refills | Status: DC

## 2019-03-07 ENCOUNTER — Other Ambulatory Visit: Payer: Self-pay | Admitting: Family Medicine

## 2019-03-10 ENCOUNTER — Encounter: Payer: Self-pay | Admitting: Family Medicine

## 2019-03-10 NOTE — Telephone Encounter (Signed)
Rx for 90 day supply

## 2019-03-25 ENCOUNTER — Other Ambulatory Visit: Payer: Self-pay

## 2019-03-25 DIAGNOSIS — C541 Malignant neoplasm of endometrium: Secondary | ICD-10-CM

## 2019-03-26 MED ORDER — MEGESTROL ACETATE 40 MG PO TABS: 40.0000 mg | ORAL_TABLET | Freq: Two times a day (BID) | ORAL | 6 refills | Status: DC

## 2019-03-26 MED ORDER — FLUOXETINE HCL 40 MG PO CAPS: 40.0000 mg | ORAL_CAPSULE | Freq: Every day | ORAL | 3 refills | Status: DC

## 2019-04-13 ENCOUNTER — Telehealth: Payer: Self-pay | Admitting: Family Medicine

## 2019-04-13 MED ORDER — BUPROPION HCL ER (XL) 150 MG PO TB24: 150.0000 mg | ORAL_TABLET | Freq: Every day | ORAL | 3 refills | Status: DC

## 2019-04-13 NOTE — Telephone Encounter (Signed)
LVM to call office back to inform her of below and assist in getting her a virtual visit per Dr. Nori Riis. Alexandra Morales, CMA

## 2019-04-13 NOTE — Telephone Encounter (Signed)
Dear Alexandra Morales Team I just found an old message that got lost (while I was on vacation). Please call her and see if you can schedule her for a virtual visit wit me in next few weeks. Please tell her I also refilled her buproprion and tell her I am sorry I did not get her message on time THANKS!  If I need to call her outside the typical clinic time, I can do that on Wed afternoon or Thursday Am of this week. THANKS!  Alexandra Morales

## 2019-04-29 NOTE — Telephone Encounter (Signed)
Pt has appointment on 05/19/2019. Dasja Zimmerman Rumple, CMA

## 2019-05-10 ENCOUNTER — Ambulatory Visit: Payer: BLUE CROSS/BLUE SHIELD | Admitting: Gynecologic Oncology

## 2019-05-19 ENCOUNTER — Telehealth (INDEPENDENT_AMBULATORY_CARE_PROVIDER_SITE_OTHER): Payer: BC Managed Care – PPO | Admitting: Family Medicine

## 2019-05-19 ENCOUNTER — Other Ambulatory Visit: Payer: Self-pay

## 2019-05-19 ENCOUNTER — Encounter: Payer: Self-pay | Admitting: Family Medicine

## 2019-05-19 DIAGNOSIS — F32A Depression, unspecified: Secondary | ICD-10-CM

## 2019-05-19 DIAGNOSIS — F329 Major depressive disorder, single episode, unspecified: Secondary | ICD-10-CM | POA: Insufficient documentation

## 2019-05-19 DIAGNOSIS — C541 Malignant neoplasm of endometrium: Secondary | ICD-10-CM

## 2019-05-19 DIAGNOSIS — F419 Anxiety disorder, unspecified: Secondary | ICD-10-CM

## 2019-05-19 MED ORDER — DIAZEPAM 5 MG PO TABS: 5.0000 mg | ORAL_TABLET | Freq: Two times a day (BID) | ORAL | 1 refills | Status: DC | PRN

## 2019-05-19 MED ORDER — INDAPAMIDE 2.5 MG PO TABS: ORAL_TABLET | ORAL | 3 refills | Status: DC

## 2019-05-19 NOTE — Progress Notes (Signed)
[place] Telemedicine Visit  Patient consented to have visit conducted via telephone/video phone call. Two separate patient identifiers used to verify identity.  Encounter participants: Patient: Alexandra Morales  Provider: [name]  Others (if applicable): none  Chief Complaint / HPI:  #1.  Follow-up anxiety: Better since we added the second medicine but still having days when she is extremely anxious.  Mostly related to recent death of her godson which was very traumatic for her.  He overdosed. 2.  She is now working as the Optometrist for [employer] and she is liking that.  Mostly working from home which is also a plus for her. 3.  She is checked her blood pressure a few times and it is usually in the 130s over 80s so the increased medication seems to be working.  She is only taking the Lozol intermittently if she has lower extremity swelling. 4.  Some left knee pain #5.  Still taking her Megace intermittently when she feels like she is going to have vaginal bleeding.  She can usually tell by having premenstrual cramps and she will take 4 to 5 days of the Megace and will not have bleeding.  ROS: No fever No productive cough.  No chest pain.  No unusual weight change No abdominal pain  OBJECTIVE observed via telephone device: PSYCH: AxOx4.  Appropriate speech fluency and content. Asks and answers questions appropriately. Mood is congruent. RESPIRATORY: no unusual sounds of labored breathing    Pertinent PMHx / PSHx:  I have reviewed the patient's medications, allergies, past medical and surgical history, smoking status and updated in the EMR as appropriate.   Assessment/Plan:  No problem-specific Assessment & Plan notes found for this encounter.    Time spent on phone or on video call with patient: 23 minutes

## 2019-05-19 NOTE — Assessment & Plan Note (Signed)
Significant approval on bupropion and fluoxetine.  Recent stressors with the death of her godson.  We will give her some as needed Valium and she will follow-up with me by phone in 4 to 6 weeks.  I have no concerns for misuse of medication.

## 2019-05-19 NOTE — Assessment & Plan Note (Signed)
Followed by Janece Canterbury.  Mirena IUD in place.  Also taking intermittent Megace.

## 2019-05-21 ENCOUNTER — Other Ambulatory Visit: Payer: Self-pay | Admitting: Family Medicine

## 2019-05-21 DIAGNOSIS — C541 Malignant neoplasm of endometrium: Secondary | ICD-10-CM

## 2019-07-28 ENCOUNTER — Other Ambulatory Visit: Payer: Self-pay | Admitting: Family Medicine

## 2019-08-05 ENCOUNTER — Inpatient Hospital Stay: Payer: BC Managed Care – PPO | Admitting: Gynecologic Oncology

## 2019-08-05 ENCOUNTER — Telehealth: Payer: Self-pay | Admitting: Oncology

## 2019-08-05 NOTE — Telephone Encounter (Signed)
Dalilah sent an email to the genetic counselor saying she needs to cancel today's appointment with Dr. Denman George.  Called her and she said she is not able to come for the appointment today.  Asked if she would like to reschedule and she said she will call back.  She is trying to stay home right now due to Covid.  She also said her bleeding is controled with Megace.

## 2019-08-13 ENCOUNTER — Other Ambulatory Visit: Payer: Self-pay

## 2019-08-13 DIAGNOSIS — C541 Malignant neoplasm of endometrium: Secondary | ICD-10-CM

## 2019-08-17 MED ORDER — MEGESTROL ACETATE 40 MG PO TABS: ORAL_TABLET | ORAL | 6 refills | Status: DC

## 2019-09-10 ENCOUNTER — Other Ambulatory Visit: Payer: Self-pay

## 2019-09-10 MED ORDER — LISINOPRIL 40 MG PO TABS: 40.0000 mg | ORAL_TABLET | Freq: Every day | ORAL | 3 refills | Status: DC

## 2019-09-14 ENCOUNTER — Other Ambulatory Visit: Payer: Self-pay | Admitting: Family Medicine

## 2019-11-17 ENCOUNTER — Other Ambulatory Visit: Payer: Self-pay | Admitting: Family Medicine

## 2019-11-17 DIAGNOSIS — C541 Malignant neoplasm of endometrium: Secondary | ICD-10-CM

## 2019-11-18 ENCOUNTER — Other Ambulatory Visit: Payer: Self-pay | Admitting: Family Medicine

## 2020-02-07 ENCOUNTER — Other Ambulatory Visit: Payer: Self-pay | Admitting: Family Medicine

## 2020-03-07 ENCOUNTER — Other Ambulatory Visit: Payer: Self-pay | Admitting: Family Medicine

## 2020-03-09 ENCOUNTER — Other Ambulatory Visit: Payer: Self-pay | Admitting: Family Medicine

## 2020-04-25 ENCOUNTER — Other Ambulatory Visit: Payer: Self-pay | Admitting: Family Medicine

## 2020-05-31 DIAGNOSIS — R748 Abnormal levels of other serum enzymes: Secondary | ICD-10-CM | POA: Diagnosis not present

## 2020-05-31 DIAGNOSIS — Z6841 Body Mass Index (BMI) 40.0 and over, adult: Secondary | ICD-10-CM | POA: Diagnosis not present

## 2020-05-31 DIAGNOSIS — J9602 Acute respiratory failure with hypercapnia: Secondary | ICD-10-CM | POA: Diagnosis not present

## 2020-05-31 DIAGNOSIS — R0602 Shortness of breath: Secondary | ICD-10-CM | POA: Diagnosis not present

## 2020-05-31 DIAGNOSIS — R0902 Hypoxemia: Secondary | ICD-10-CM | POA: Diagnosis not present

## 2020-05-31 DIAGNOSIS — R7401 Elevation of levels of liver transaminase levels: Secondary | ICD-10-CM | POA: Diagnosis not present

## 2020-05-31 DIAGNOSIS — E282 Polycystic ovarian syndrome: Secondary | ICD-10-CM | POA: Diagnosis not present

## 2020-05-31 DIAGNOSIS — J9601 Acute respiratory failure with hypoxia: Secondary | ICD-10-CM | POA: Diagnosis not present

## 2020-05-31 DIAGNOSIS — Z20822 Contact with and (suspected) exposure to covid-19: Secondary | ICD-10-CM | POA: Diagnosis not present

## 2020-05-31 DIAGNOSIS — R160 Hepatomegaly, not elsewhere classified: Secondary | ICD-10-CM | POA: Diagnosis not present

## 2020-05-31 DIAGNOSIS — I2699 Other pulmonary embolism without acute cor pulmonale: Secondary | ICD-10-CM | POA: Diagnosis not present

## 2020-05-31 DIAGNOSIS — R9431 Abnormal electrocardiogram [ECG] [EKG]: Secondary | ICD-10-CM | POA: Diagnosis not present

## 2020-05-31 DIAGNOSIS — R230 Cyanosis: Secondary | ICD-10-CM | POA: Diagnosis not present

## 2020-05-31 DIAGNOSIS — R778 Other specified abnormalities of plasma proteins: Secondary | ICD-10-CM | POA: Diagnosis not present

## 2020-05-31 DIAGNOSIS — N179 Acute kidney failure, unspecified: Secondary | ICD-10-CM | POA: Diagnosis not present

## 2020-05-31 DIAGNOSIS — S31809S Unspecified open wound of unspecified buttock, sequela: Secondary | ICD-10-CM | POA: Diagnosis not present

## 2020-05-31 DIAGNOSIS — I459 Conduction disorder, unspecified: Secondary | ICD-10-CM | POA: Diagnosis not present

## 2020-05-31 DIAGNOSIS — R069 Unspecified abnormalities of breathing: Secondary | ICD-10-CM | POA: Diagnosis not present

## 2020-05-31 DIAGNOSIS — L308 Other specified dermatitis: Secondary | ICD-10-CM | POA: Diagnosis not present

## 2020-05-31 DIAGNOSIS — I10 Essential (primary) hypertension: Secondary | ICD-10-CM | POA: Diagnosis not present

## 2020-06-06 DIAGNOSIS — I2699 Other pulmonary embolism without acute cor pulmonale: Secondary | ICD-10-CM | POA: Insufficient documentation

## 2020-06-14 ENCOUNTER — Ambulatory Visit: Payer: BC Managed Care – PPO | Admitting: Family Medicine

## 2020-06-14 ENCOUNTER — Telehealth: Payer: Self-pay

## 2020-06-14 NOTE — Telephone Encounter (Signed)
Patient calls nurse line regarding not being able to keep scheduled appointment for today. Patient states that she was recently discharged from the hospital and that she was too weak to go down the stairs at her house today.   Patient is requesting returned call from provider to discuss receiving cardiac or respiratory therapy. Patient reports that her O2 saturations remain at 97% on room air with no exertion. However, with exertion saturations drop into the 80s with 3 L of oxygen. Patient states that she also has LOA paperwork that she needs completed.   To PCP  Please advise next steps for patient.   Talbot Grumbling, RN

## 2020-06-19 NOTE — Telephone Encounter (Signed)
lvm on her mobile number Alexandra Morales

## 2020-06-21 ENCOUNTER — Encounter: Payer: Self-pay | Admitting: Family Medicine

## 2020-06-22 ENCOUNTER — Telehealth (INDEPENDENT_AMBULATORY_CARE_PROVIDER_SITE_OTHER): Payer: BC Managed Care – PPO | Admitting: Family Medicine

## 2020-06-22 ENCOUNTER — Encounter: Payer: Self-pay | Admitting: Family Medicine

## 2020-06-22 DIAGNOSIS — J9601 Acute respiratory failure with hypoxia: Secondary | ICD-10-CM

## 2020-06-22 DIAGNOSIS — I2699 Other pulmonary embolism without acute cor pulmonale: Secondary | ICD-10-CM

## 2020-06-22 DIAGNOSIS — J9611 Chronic respiratory failure with hypoxia: Secondary | ICD-10-CM | POA: Insufficient documentation

## 2020-06-22 NOTE — Assessment & Plan Note (Signed)
Her oxygen saturations at rest on 3L are 90-97%. With activity of ADLs such as showering, for which she requires and utilizes her sister for safety, her sats can drop into the 78-79 range. WIth rest they return to 90 range in a few minutes. She is unable to climb or descend stairs so is essentially homebound at this time. Contniue HH oxygen F/u 2 weeks

## 2020-06-22 NOTE — Progress Notes (Signed)
[place] Telemedicine Visit  Patient consented to have virtual visit and was identified by name and date of birth. Method of visit: Video Using MyChart Encounter participants: Patient: Alexandra Morales - located at her home Provider: [name] - located at [place] Others (if applicable): none  Chief Complaint: follow up of recent hospitalization for pulmonary embolus and infarction with acute hypoxic respiratory failue  HPI:  Has returned home under care of her sister 24/7 Has been seen by HHPT from [facility] and receiving her supplemental oxygen through [facility]. She needs a bariatric rolling walker per HHPT but there is evidently a back order on that. She has  The shower seat/tub chair. SHe is using a non-bariatric rolling walker  and it does not fit her. Her oxygen saturations at rest on 3L are 90-97%. With activity of ADLs such as showering, for which she requires and utilizes her sister for safety, her sats can drop into the 78-79 range. WIth rest they return to 90 range in a few minutes. She is unable to climb or descend stairs so is essentially homebound at this time.  She was hoping to go back to work full time at the end of this month, but remains quite fatigued.  She has several questions about her hospitalization, long term anticoagulation ion setting of intermittent vaginal bleeding from her known endometrial cancer. Not currently surgical candidate for hysterectomy secondary to BMI. Has stopped her megace.  ROS: per HPI  Pertinent PMHx: endometrial cancer Recent ICU stay for pulmonary embolus and infarction, acute hypoxic respiratory failue.  Exam:  There were no vitals taken for this visit.  Respiratory: unlabored breathing with patient at rest and using her supplemental oxygen PSYCH: AxOx4. Good eye contact.. No psychomotor retardation or agitation. Appropriate speech fluency and content. Asks and answers questions  appropriately. Mood is congruent. Skin color: no cyanosis noted  Assessment/Plan:  Acute respiratory failure with hypoxia (HCC) Her oxygen saturations at rest on 3L are 90-97%. With activity of ADLs such as showering, for which she requires and utilizes her sister for safety, her sats can drop into the 78-79 range. WIth rest they return to 90 range in a few minutes. She is unable to climb or descend stairs so is essentially homebound at this time. Contniue HH oxygen F/u 2 weeks  Pulmonary embolus and infarction [facility]) Currently on apixaban Suspect whe will need lifelong anticoagulation, or at least until or if she has definitive treatment of endometrial cancer    Time spent during visit with patient: 30 minutes

## 2020-06-22 NOTE — Assessment & Plan Note (Signed)
Currently on apixaban Suspect whe will need lifelong anticoagulation, or at least until or if she has definitive treatment of endometrial cancer

## 2020-07-05 ENCOUNTER — Other Ambulatory Visit: Payer: Self-pay

## 2020-07-05 ENCOUNTER — Encounter: Payer: Self-pay | Admitting: Family Medicine

## 2020-07-05 ENCOUNTER — Telehealth (INDEPENDENT_AMBULATORY_CARE_PROVIDER_SITE_OTHER): Payer: BC Managed Care – PPO | Admitting: Family Medicine

## 2020-07-05 DIAGNOSIS — Z9981 Dependence on supplemental oxygen: Secondary | ICD-10-CM

## 2020-07-05 DIAGNOSIS — J9611 Chronic respiratory failure with hypoxia: Secondary | ICD-10-CM | POA: Diagnosis not present

## 2020-07-05 DIAGNOSIS — I2699 Other pulmonary embolism without acute cor pulmonale: Secondary | ICD-10-CM | POA: Diagnosis not present

## 2020-07-05 DIAGNOSIS — C541 Malignant neoplasm of endometrium: Secondary | ICD-10-CM

## 2020-07-05 MED ORDER — APIXABAN 5 MG PO TABS: 5.0000 mg | ORAL_TABLET | Freq: Two times a day (BID) | ORAL | 3 refills | Status: DC

## 2020-07-05 NOTE — Progress Notes (Signed)
[place] Telemedicine Visit  Patient consented to have virtual visit and was identified by name and date of birth. Method of visit: Video  Encounter participants: Patient: Alexandra Morales - located at Home Provider: [name] - located at [facility] Others (if applicable): none  Chief Complaint: follow up acute respiratory hypoxic failure with hospitalization for pulmonary embolus    HPI:  Improving in strength-- able to bathe without assistance but still very SOB abd using shower chair Still using supplemental oxygen. At rest she is not sure she needs it, but with any exertion even on the 3 L [place] she drops to 88% range (this is an improvement  From last visit where she was dropping into 70 range). She feels she can likely return to work on Monday as we had planned: she works from home and her main concern is sitting for long periods and her stamina. She will be able to continue her supplemental oxygen. She does fatigue on some days with very little activity. She isdoing the HEP regularly and walking around the house regularly. She has been outside to walk but stairs are still very difficult physically foor her and she gets SOB; she is also a but anxious she will fall as she feels a little unsteady.  Had some very light pink scant vaginal discharge  Needs refill on apixiban. It is quite exensive even w insurance coverage.  ROS: per HPI  Pertinent PMHx: endometrial cancer, non operative secondary to BMI, has Mirena in place, Recent hospital stay for acute respiratory hypoxic failure  for pulmonary embolus, still requiring supplemental oxygen at 3 L [place]   Exam:  There were no vitals taken for this visit.  Respiratory: she is using [place] and is not visibly SOB. Her skin color is good. She is in no apparent distress. PSYCHIATRIC: Alert and oriented X 4. Affect is interactive. Denies suicidal  and homicidal ideation. Denies hallucinations. Speech is normal in fluency  and contnt. Asks and answers questions appropriately. No psychomotor retardation. No agitation. Recent and remote memory intact. Judgement intact.   Assessment/Plan:  Chronic respiratory failure with hypoxia, on home O2 therapy (HCC) Given her improvement to a desat of 80 range with exertion, I think she is improving.  I would have her wean oxygen as tolerated while at rest but continue it with any type of exertion we will check back in 3 to 4 weeks.  This may be a very slow wean.  Pulmonary embolus and infarction (HCC) Continue apixaban and have called in refills.  It is very expensive so I will check with pharmacy to see if she is eligible for any assistance.  Endometrial cancer ([place]) Fortunately she is not currently having any severe vaginal bleeding.  She is off Megace right now.  Will continue to follow this and she will call me in the interim if she develops significant vaginal bleeding.  Combination of need for anticoagulation long-term as well as existing endometrial cancer that is currently nonoperative secondary to BMI, places her in a very tricky space.   Video visit although we were having some sound issues so I ended up also calling her on the telephone. Time spent during visit with patient: 30 minutes  We have scheduled her back as a video visit in 3 to 4 weeks.  She will call me in the interim with any new or worrisome symptoms.  I will be happy to fill out her FMLA paperwork for intermittent FMLA when she  faxes it to me.  She may need 1 to 5 days off per month depending on her tolerance for return to work.  She is very determined that she wants to go back to work Monday and I think she will generally do well but I do not want her to push it too hard initially.

## 2020-07-05 NOTE — Assessment & Plan Note (Signed)
Given her improvement to a desat of 80 range with exertion, I think she is improving.  I would have her wean oxygen as tolerated while at rest but continue it with any type of exertion we will check back in 3 to 4 weeks.  This may be a very slow wean.

## 2020-07-05 NOTE — Assessment & Plan Note (Signed)
Fortunately she is not currently having any severe vaginal bleeding.  She is off Megace right now.  Will continue to follow this and she will call me in the interim if she develops significant vaginal bleeding.  Combination of need for anticoagulation long-term as well as existing endometrial cancer that is currently nonoperative secondary to BMI, places her in a very tricky space.

## 2020-07-05 NOTE — Assessment & Plan Note (Signed)
Continue apixaban and have called in refills.  It is very expensive so I will check with pharmacy to see if she is eligible for any assistance.

## 2020-07-07 DIAGNOSIS — R0602 Shortness of breath: Secondary | ICD-10-CM | POA: Diagnosis not present

## 2020-08-02 ENCOUNTER — Telehealth (INDEPENDENT_AMBULATORY_CARE_PROVIDER_SITE_OTHER): Payer: BC Managed Care – PPO | Admitting: Family Medicine

## 2020-08-02 ENCOUNTER — Encounter: Payer: Self-pay | Admitting: Family Medicine

## 2020-08-02 DIAGNOSIS — J9611 Chronic respiratory failure with hypoxia: Secondary | ICD-10-CM

## 2020-08-02 DIAGNOSIS — I2699 Other pulmonary embolism without acute cor pulmonale: Secondary | ICD-10-CM

## 2020-08-02 DIAGNOSIS — C541 Malignant neoplasm of endometrium: Secondary | ICD-10-CM

## 2020-08-02 DIAGNOSIS — D500 Iron deficiency anemia secondary to blood loss (chronic): Secondary | ICD-10-CM

## 2020-08-02 DIAGNOSIS — Z9981 Dependence on supplemental oxygen: Secondary | ICD-10-CM

## 2020-08-02 NOTE — Assessment & Plan Note (Signed)
Given her recent vaginal bleeding and her fatigue noted afterwards, I am a little concerned that she dropped her hemoglobin into the anemia range.  She still homebound and she says she is feeling better so right now we will not try to get her in for hemoglobin.  Should she have worsening symptoms or start bleeding again she will needs to let me know.

## 2020-08-02 NOTE — Progress Notes (Signed)
[place] Telemedicine Visit  Patient consented to have virtual visit and was identified by name and date of birth. Method of visit: Video  Encounter participants: Patient: Alexandra Morales - located at home Provider: [name] [facility] Others (if applicable): None  Chief Complaint: Follow-up of pulmonary embolus, anemia, acute respiratory failure with hypoxia.  HPI:  Doing a little better with shortness of breath and exertion.  She is still wearing her 3 L of oxygen all the time.  When at rest she is in about the 96 to 97% saturation range but when she gets up moves around the house she drops to the mid 80s.  #2.  Had an episode of vaginal bleeding that lasted about a week.  Had a lot of pelvic cramping with it and felt like credibly fatigued after that for about a week to 10 days.  It stopped on its own after she took 2 or 3 days of Megace.  ROS: per HPI  Pertinent PMHx: Endometrial cancer, nonoperative candidate currently secondary to habitus, Mirena in place.  We had discontinued her Megace secondary to her pulmonary embolus.  Exam:  Ht 5\' 7"  (1.702 m)   BMI 70.01 kg/m   Respiratory: She is breathing comfortably using her nasal cannula.  Assessment/Plan:  Pulmonary embolus and infarction (HCC) Continue apixaban indefinitely.  Chronic respiratory failure with hypoxia, on home O2 therapy ([place]) She seems to be doing ever so slightly better.  Her saturations on 3 L with exertion are about 85% versus same amount of activity caused her to drop into the mid 70s when she first came home.  This is encouraging.  Continue home oxygen at this point.  Iron deficiency anemia due to chronic blood loss Given her recent vaginal bleeding and her fatigue noted afterwards, I am a little concerned that she dropped her hemoglobin into the anemia range.  She still homebound and she says she is feeling better so right now we will not try to get her  in for hemoglobin.  Should she have worsening symptoms or start bleeding again she will needs to let me know.  Endometrial cancer ([place]) Difficult situation with need for lifelong or at least chronic anticoagulation in setting of known endometrial cancer nonoperative candidate.  I told her to give me a call if she has more vaginal bleeding.  I do think we will try to avoid Megace daily but we may have to use it for day or 2 intermittently if she has recurrence of vaginal bleeding.  I will see her back in January.  She will definitely call me in the interim with new or worsening symptoms.    Time spent during visit with patient: 30 minutes

## 2020-08-02 NOTE — Assessment & Plan Note (Signed)
Difficult situation with need for lifelong or at least chronic anticoagulation in setting of known endometrial cancer nonoperative candidate.  I told her to give me a call if she has more vaginal bleeding.  I do think we will try to avoid Megace daily but we may have to use it for day or 2 intermittently if she has recurrence of vaginal bleeding.  I will see her back in January.  She will definitely call me in the interim with new or worsening symptoms.

## 2020-08-02 NOTE — Assessment & Plan Note (Signed)
Continue apixaban indefinitely.

## 2020-08-02 NOTE — Assessment & Plan Note (Signed)
She seems to be doing ever so slightly better.  Her saturations on 3 L with exertion are about 85% versus same amount of activity caused her to drop into the mid 70s when she first came home.  This is encouraging.  Continue home oxygen at this point.

## 2020-08-06 DIAGNOSIS — R0602 Shortness of breath: Secondary | ICD-10-CM | POA: Diagnosis not present

## 2020-08-07 ENCOUNTER — Telehealth: Payer: Self-pay

## 2020-08-07 NOTE — Telephone Encounter (Signed)
Patient LVM on nurse line to confirm that we received her FMLA paperwork. Paperwork has been placed in provider box for completion. Attempted to call patient to inform that we received paperwork. Patient did not answer, left VM that paperwork had been received.  Will notify patient once completed.   Talbot Grumbling, RN

## 2020-08-14 ENCOUNTER — Encounter: Payer: Self-pay | Admitting: Family Medicine

## 2020-08-16 NOTE — Telephone Encounter (Signed)
Patient calls nurse line checking the status of forms that were filled out on 12/21 by PCP. I spoke with Jerene Pitch who has faxed forms twice now. I advised patient to call and make sure they have been received and if not to get a better fax number.

## 2020-08-30 ENCOUNTER — Encounter: Payer: Self-pay | Admitting: Family Medicine

## 2020-08-30 ENCOUNTER — Telehealth (INDEPENDENT_AMBULATORY_CARE_PROVIDER_SITE_OTHER): Payer: BC Managed Care – PPO | Admitting: Family Medicine

## 2020-08-30 ENCOUNTER — Other Ambulatory Visit: Payer: Self-pay

## 2020-08-30 DIAGNOSIS — I2699 Other pulmonary embolism without acute cor pulmonale: Secondary | ICD-10-CM

## 2020-08-30 NOTE — Progress Notes (Signed)
  [facility] Telemedicine Visit  Patient consented to have virtual visit and was identified by name and date of birth. Method of visit: Video  Encounter participants: Patient: Alexandra Morales - located at [place] Provider: Denny Levy - located at [facility] office Others (if applicable): N/A  Chief Complaint: f/u pulmonary embolus, oxygen requirement, homebound status, endometrial cancer  HPI:  Doing relatively well 1. RESP: still using oxygen. Has improved level of desat with activity now (usually > 86% with activity). Truying to be more active around the house, walking inside etc. Trying to do as much of her ADLs as she can. Still needs some help wit laundry but bathing alone OK. 2. Anticoagulation for PE: Continues on Eliquis without problem No episodes of vaginal bleeding or other unusual bruising.  3. Endometrial Cancer with Mirena in place: occasionally will have some crammping, which in past was always a prelude to vaginal bleeding, and she will take a megace tab one or two days and it goes away. Otherwise not using megace/ 4. Hx anemia: currently feels energy level is good. Says she has always been able to tell if she was getting significantly anemic in past by her energy level. 5. Still not leaving house. ROS: per HPI  Pertinent PMHx: see above  Exam:  There were no vitals taken for this visit.  Respiratory: non labored Skin is not pale PSYCH: AxOx4. Good eye contact.. No psychomotor retardation or agitation. Appropriate speech fluency and content. Asks and answers questions appropriately. Mood is congruent.  Assessment/Plan:  Pulmonary embolus and infarction (HCC) Continue gradual addition of activity Continue oxygen supplementation Continue eliquis (lifetime) F/u 2 m or sooner with problems    Time spent during visit with patient: 30 minutes

## 2020-08-31 NOTE — Assessment & Plan Note (Signed)
Continue gradual addition of activity Continue oxygen supplementation Continue eliquis (lifetime) F/u 2 m or sooner with problems

## 2020-09-04 ENCOUNTER — Other Ambulatory Visit: Payer: Self-pay | Admitting: Family Medicine

## 2020-09-04 DIAGNOSIS — J9611 Chronic respiratory failure with hypoxia: Secondary | ICD-10-CM

## 2020-09-04 DIAGNOSIS — I2699 Other pulmonary embolism without acute cor pulmonale: Secondary | ICD-10-CM

## 2020-09-04 NOTE — Progress Notes (Signed)
Needs bariatric walker. They had only regular size on her discharge from hospital so they gave her reg size on and that is small for her. It has two fron wheels, no seat. She uses it right now in home only as she is home bound currently.

## 2020-09-06 ENCOUNTER — Telehealth: Payer: Self-pay | Admitting: *Deleted

## 2020-09-06 DIAGNOSIS — R0602 Shortness of breath: Secondary | ICD-10-CM | POA: Diagnosis not present

## 2020-09-06 NOTE — Telephone Encounter (Signed)
-----   Message from [name], MD sent at 09/05/2020  4:19 PM EST ----- Alexandra Morales or Alexandra Morales Can you make an appt for her in about 4-5 weeks for a virtual appt on my schedule? Thanks! [name]

## 2020-09-06 NOTE — Telephone Encounter (Signed)
LVM for pt to call office to inform her of her appointment with Dr. Nori Riis via [place] is scheduled for 10/18/2020 @ 8:30am.  Will also send a MyCHart message to inform her of this.Alexandra Morales, CMA

## 2020-09-07 NOTE — Telephone Encounter (Signed)
Pt informed of appointment time.Alexandra Morales, CMA

## 2020-09-12 ENCOUNTER — Other Ambulatory Visit: Payer: Self-pay | Admitting: Family Medicine

## 2020-10-07 DIAGNOSIS — R0602 Shortness of breath: Secondary | ICD-10-CM | POA: Diagnosis not present

## 2020-10-18 ENCOUNTER — Telehealth: Payer: BC Managed Care – PPO | Admitting: Family Medicine

## 2020-10-20 ENCOUNTER — Telehealth (INDEPENDENT_AMBULATORY_CARE_PROVIDER_SITE_OTHER): Payer: BC Managed Care – PPO | Admitting: Family Medicine

## 2020-10-20 ENCOUNTER — Other Ambulatory Visit: Payer: Self-pay

## 2020-10-20 ENCOUNTER — Other Ambulatory Visit: Payer: Self-pay | Admitting: Family Medicine

## 2020-10-20 DIAGNOSIS — F419 Anxiety disorder, unspecified: Secondary | ICD-10-CM

## 2020-10-20 DIAGNOSIS — G43109 Migraine with aura, not intractable, without status migrainosus: Secondary | ICD-10-CM

## 2020-10-20 DIAGNOSIS — Z947 Corneal transplant status: Secondary | ICD-10-CM

## 2020-10-20 DIAGNOSIS — I2699 Other pulmonary embolism without acute cor pulmonale: Secondary | ICD-10-CM

## 2020-10-20 DIAGNOSIS — N939 Abnormal uterine and vaginal bleeding, unspecified: Secondary | ICD-10-CM | POA: Diagnosis not present

## 2020-10-20 DIAGNOSIS — F32A Depression, unspecified: Secondary | ICD-10-CM

## 2020-10-20 MED ORDER — APIXABAN 5 MG PO TABS
5.0000 mg | ORAL_TABLET | Freq: Two times a day (BID) | ORAL | 3 refills | Status: DC
Start: 2020-10-20 — End: 2021-03-21

## 2020-10-20 MED ORDER — BUPROPION HCL ER (XL) 150 MG PO TB24: 150.0000 mg | ORAL_TABLET | Freq: Every day | ORAL | 1 refills | Status: DC

## 2020-10-20 MED ORDER — NURTEC 75 MG PO TBDP: ORAL_TABLET | ORAL | 1 refills | Status: DC

## 2020-10-22 DIAGNOSIS — G43109 Migraine with aura, not intractable, without status migrainosus: Secondary | ICD-10-CM | POA: Insufficient documentation

## 2020-10-22 NOTE — Assessment & Plan Note (Signed)
Refill Nurtec and monitor. If frequency  continue to be perisitent or symptoms change/ worsen we will consider further evaluation. At this time it seems like recurrence of her typical migraines she had when she was younger just with recent increase in frequency. She seems to think it may be related to increased screen time with her current job and possibly need for eye exam. She is encouraged to get eye exam by optometry.

## 2020-10-22 NOTE — Progress Notes (Signed)
[place] Telemedicine Visit  Patient consented to have virtual visit and was identified by name and date of birth. Method of visit: Video  Encounter participants: Patient: Alexandra Morales - located at home Provider: [name] - located at office Others (if applicable): none  Chief Complaint: hypoxia Fatigue Pulmonary embolus anticoagulation headaches  HPI: Continues with home oxygen supplementation. Still SOB with exertions without it Fatigue better Continues anticoagulation without problem  Has had recurrence of migraines on  Increased frequency over last 1 month. Nurtec works for them if she takes it early enough. Migraine typical of her past ones. No new symptoms, just had not had this frequency in last few years. Now 2-3 per week.   ROS: per HPI  Pertinent PMHx: pulmonary embolus, lifetime anticoagulation  Exam:  There were no vitals taken for this visit.  Respiratory: using home oxygen. Normal respiratory effort. PSYCH: AxOx4. Good eye contact.. No psychomotor retardation or agitation. Appropriate speech fluency and content. Asks and answers questions appropriately. Mood is congruent.   Assessment/Plan:  Pulmonary embolus and infarction (HCC) Continue apixiban Lifelong anticoagulation is most likely picture gieven underlying risk facotrs (endometrial cancer, obesity, prior hx PE)  Abnormal uterine bleeding ([facility]) No significant vaginal bleeding  Migraine with aura and without status migrainosus, not intractable Refill Nurtec and monitor. If frequency  continue to be perisitent or symptoms change/ worsen we will consider further evaluation. At this time it seems like recurrence of her typical migraines she had when she was younger just with recent increase in frequency. She seems to think it may be related to increased screen time with her current job and possibly need for eye exam. She is encouraged to get eye exam by optometry.  Status post  corneal transplant Needs eye exam and is reminded  Anxiety and depression Stable Continue current meds appt 1 month    Time spent during visit with patient: 30 minutes

## 2020-10-22 NOTE — Assessment & Plan Note (Signed)
Stable Continue current meds appt 1 month

## 2020-10-22 NOTE — Assessment & Plan Note (Signed)
No significant vaginal bleeding

## 2020-10-22 NOTE — Assessment & Plan Note (Signed)
Needs eye exam and is reminded

## 2020-10-22 NOTE — Assessment & Plan Note (Signed)
Continue apixiban Lifelong anticoagulation is most likely picture gieven underlying risk facotrs (endometrial cancer, obesity, prior hx PE)

## 2020-10-24 ENCOUNTER — Encounter: Payer: Self-pay | Admitting: Family Medicine

## 2020-10-24 NOTE — Telephone Encounter (Signed)
Attempted to call patient to discuss further. LVM for patient to return call to office.   Talbot Grumbling, RN

## 2020-10-25 ENCOUNTER — Telehealth: Payer: Self-pay

## 2020-10-25 NOTE — Telephone Encounter (Signed)
Patient contacted in regards to [employer]. Patient reports "dark" color urine despite drinking "plenty" of water. Patient reports urinary frequency and chills. Patient unsure if she has been feverish, she has not checked her temperature, however has been waking up "drenched." Patient reports symptoms have been going on for about 2 days. Patient denies back or abdominal pain at this time. Patient reports her blood pressure has been ~130s/70s today with O2 ~92/93%. Patient offered an apt, however she declined at this time due to transportation and "other things." Patient hopeful PCP will treat her over the phone. Will forward to PCP to advise.

## 2020-10-27 MED ORDER — CEPHALEXIN 250 MG PO CAPS: 250.0000 mg | ORAL_CAPSULE | Freq: Three times a day (TID) | ORAL | 0 refills | Status: DC

## 2020-10-27 NOTE — Telephone Encounter (Signed)
Dear Dema Severin Team Please let her know I am calling in an antibiotic and if she does not have complete resolution ofher symptoms in a few days, or if she gets worse, she needs to be seen. THANKS! [name]

## 2020-11-04 DIAGNOSIS — R0602 Shortness of breath: Secondary | ICD-10-CM | POA: Diagnosis not present

## 2020-12-05 DIAGNOSIS — R0602 Shortness of breath: Secondary | ICD-10-CM | POA: Diagnosis not present

## 2020-12-06 ENCOUNTER — Telehealth: Payer: Self-pay

## 2020-12-06 NOTE — Telephone Encounter (Signed)
Received fax from pharmacy, PA needed on Nurtec 75 mg tablets.  Clinical questions submitted via Cover My Meds.  Waiting on response, could take up to 72 hours.  Cover My Meds info: Key: [id number]  Talbot Grumbling, RN

## 2020-12-07 NOTE — Telephone Encounter (Addendum)
Please the below determination.     Insurance company will be sending fax with next steps.   Talbot Grumbling, RN

## 2021-01-10 ENCOUNTER — Telehealth: Payer: Self-pay

## 2021-01-10 NOTE — Telephone Encounter (Signed)
Received fax from pharmacy, PA needed on Eliquis 5mg .  Clinical questions submitted via Cover My Meds.  Waiting on response, could take up to 72 hours.  Cover My Meds info: Key: [id number]  Talbot Grumbling, RN

## 2021-01-10 NOTE — Telephone Encounter (Signed)
Received approval through [employer]. Approval from 01/09/2021-01/09/2022.   Called pharmacy and provided approval.   Called and left HIPPA compliant VM with patient to return call to office for update.   Talbot Grumbling, RN

## 2021-02-10 ENCOUNTER — Other Ambulatory Visit: Payer: Self-pay | Admitting: Family Medicine

## 2021-03-01 DIAGNOSIS — Z8542 Personal history of malignant neoplasm of other parts of uterus: Secondary | ICD-10-CM | POA: Diagnosis not present

## 2021-03-01 DIAGNOSIS — R000 Tachycardia, unspecified: Secondary | ICD-10-CM | POA: Diagnosis not present

## 2021-03-01 DIAGNOSIS — Z452 Encounter for adjustment and management of vascular access device: Secondary | ICD-10-CM | POA: Diagnosis not present

## 2021-03-01 DIAGNOSIS — R0902 Hypoxemia: Secondary | ICD-10-CM | POA: Diagnosis not present

## 2021-03-01 DIAGNOSIS — F419 Anxiety disorder, unspecified: Secondary | ICD-10-CM | POA: Diagnosis not present

## 2021-03-01 DIAGNOSIS — Y998 Other external cause status: Secondary | ICD-10-CM | POA: Diagnosis not present

## 2021-03-01 DIAGNOSIS — I2699 Other pulmonary embolism without acute cor pulmonale: Secondary | ICD-10-CM | POA: Diagnosis not present

## 2021-03-01 DIAGNOSIS — R531 Weakness: Secondary | ICD-10-CM | POA: Diagnosis not present

## 2021-03-01 DIAGNOSIS — Z9181 History of falling: Secondary | ICD-10-CM | POA: Diagnosis not present

## 2021-03-01 DIAGNOSIS — B9620 Unspecified Escherichia coli [E. coli] as the cause of diseases classified elsewhere: Secondary | ICD-10-CM | POA: Diagnosis not present

## 2021-03-01 DIAGNOSIS — I5022 Chronic systolic (congestive) heart failure: Secondary | ICD-10-CM | POA: Diagnosis not present

## 2021-03-01 DIAGNOSIS — M25561 Pain in right knee: Secondary | ICD-10-CM | POA: Diagnosis not present

## 2021-03-01 DIAGNOSIS — N179 Acute kidney failure, unspecified: Secondary | ICD-10-CM | POA: Diagnosis not present

## 2021-03-01 DIAGNOSIS — I5020 Unspecified systolic (congestive) heart failure: Secondary | ICD-10-CM | POA: Diagnosis not present

## 2021-03-01 DIAGNOSIS — I959 Hypotension, unspecified: Secondary | ICD-10-CM | POA: Diagnosis not present

## 2021-03-01 DIAGNOSIS — J9621 Acute and chronic respiratory failure with hypoxia: Secondary | ICD-10-CM | POA: Diagnosis not present

## 2021-03-01 DIAGNOSIS — W19XXXA Unspecified fall, initial encounter: Secondary | ICD-10-CM | POA: Diagnosis not present

## 2021-03-01 DIAGNOSIS — I480 Paroxysmal atrial fibrillation: Secondary | ICD-10-CM | POA: Diagnosis not present

## 2021-03-01 DIAGNOSIS — J9611 Chronic respiratory failure with hypoxia: Secondary | ICD-10-CM | POA: Diagnosis not present

## 2021-03-01 DIAGNOSIS — N3000 Acute cystitis without hematuria: Secondary | ICD-10-CM | POA: Diagnosis not present

## 2021-03-01 DIAGNOSIS — K219 Gastro-esophageal reflux disease without esophagitis: Secondary | ICD-10-CM | POA: Diagnosis not present

## 2021-03-01 DIAGNOSIS — D509 Iron deficiency anemia, unspecified: Secondary | ICD-10-CM | POA: Diagnosis not present

## 2021-03-01 DIAGNOSIS — C541 Malignant neoplasm of endometrium: Secondary | ICD-10-CM | POA: Diagnosis not present

## 2021-03-01 DIAGNOSIS — R2689 Other abnormalities of gait and mobility: Secondary | ICD-10-CM | POA: Diagnosis not present

## 2021-03-01 DIAGNOSIS — Z9981 Dependence on supplemental oxygen: Secondary | ICD-10-CM | POA: Diagnosis not present

## 2021-03-01 DIAGNOSIS — Z947 Corneal transplant status: Secondary | ICD-10-CM | POA: Diagnosis not present

## 2021-03-01 DIAGNOSIS — N939 Abnormal uterine and vaginal bleeding, unspecified: Secondary | ICD-10-CM | POA: Diagnosis not present

## 2021-03-01 DIAGNOSIS — Z20822 Contact with and (suspected) exposure to covid-19: Secondary | ICD-10-CM | POA: Diagnosis not present

## 2021-03-01 DIAGNOSIS — D6859 Other primary thrombophilia: Secondary | ICD-10-CM | POA: Diagnosis not present

## 2021-03-01 DIAGNOSIS — Z86711 Personal history of pulmonary embolism: Secondary | ICD-10-CM | POA: Diagnosis not present

## 2021-03-01 DIAGNOSIS — E662 Morbid (severe) obesity with alveolar hypoventilation: Secondary | ICD-10-CM | POA: Diagnosis not present

## 2021-03-01 DIAGNOSIS — I420 Dilated cardiomyopathy: Secondary | ICD-10-CM | POA: Diagnosis not present

## 2021-03-01 DIAGNOSIS — I482 Chronic atrial fibrillation, unspecified: Secondary | ICD-10-CM | POA: Diagnosis not present

## 2021-03-01 DIAGNOSIS — S2020XA Contusion of thorax, unspecified, initial encounter: Secondary | ICD-10-CM | POA: Diagnosis not present

## 2021-03-01 DIAGNOSIS — L304 Erythema intertrigo: Secondary | ICD-10-CM | POA: Diagnosis not present

## 2021-03-01 DIAGNOSIS — Z8589 Personal history of malignant neoplasm of other organs and systems: Secondary | ICD-10-CM | POA: Diagnosis not present

## 2021-03-01 DIAGNOSIS — I4891 Unspecified atrial fibrillation: Secondary | ICD-10-CM | POA: Diagnosis not present

## 2021-03-01 DIAGNOSIS — Z7409 Other reduced mobility: Secondary | ICD-10-CM | POA: Diagnosis not present

## 2021-03-01 DIAGNOSIS — M25571 Pain in right ankle and joints of right foot: Secondary | ICD-10-CM | POA: Diagnosis not present

## 2021-03-01 DIAGNOSIS — I130 Hypertensive heart and chronic kidney disease with heart failure and stage 1 through stage 4 chronic kidney disease, or unspecified chronic kidney disease: Secondary | ICD-10-CM | POA: Diagnosis not present

## 2021-03-01 DIAGNOSIS — Z91128 Patient's intentional underdosing of medication regimen for other reason: Secondary | ICD-10-CM | POA: Diagnosis not present

## 2021-03-01 DIAGNOSIS — Z6841 Body Mass Index (BMI) 40.0 and over, adult: Secondary | ICD-10-CM | POA: Diagnosis not present

## 2021-03-20 ENCOUNTER — Telehealth: Payer: Self-pay

## 2021-03-20 NOTE — Telephone Encounter (Signed)
Patient LVM on nurse line to verify that we received fax with LOA paperwork.   Paperwork is in provider's box for completion. Called patient and provided update that we received paperwork. Patient states that paper work is due by 03/26/21. Please advise.   Talbot Grumbling, RN

## 2021-03-21 ENCOUNTER — Telehealth: Payer: Self-pay | Admitting: Family Medicine

## 2021-03-21 DIAGNOSIS — M25511 Pain in right shoulder: Secondary | ICD-10-CM | POA: Diagnosis not present

## 2021-03-21 DIAGNOSIS — J9612 Chronic respiratory failure with hypercapnia: Secondary | ICD-10-CM | POA: Diagnosis not present

## 2021-03-21 DIAGNOSIS — I420 Dilated cardiomyopathy: Secondary | ICD-10-CM | POA: Diagnosis not present

## 2021-03-21 DIAGNOSIS — I130 Hypertensive heart and chronic kidney disease with heart failure and stage 1 through stage 4 chronic kidney disease, or unspecified chronic kidney disease: Secondary | ICD-10-CM | POA: Diagnosis not present

## 2021-03-21 DIAGNOSIS — Z86711 Personal history of pulmonary embolism: Secondary | ICD-10-CM | POA: Diagnosis not present

## 2021-03-21 DIAGNOSIS — Z6841 Body Mass Index (BMI) 40.0 and over, adult: Secondary | ICD-10-CM | POA: Diagnosis not present

## 2021-03-21 DIAGNOSIS — I5022 Chronic systolic (congestive) heart failure: Secondary | ICD-10-CM | POA: Diagnosis not present

## 2021-03-21 DIAGNOSIS — D509 Iron deficiency anemia, unspecified: Secondary | ICD-10-CM | POA: Diagnosis not present

## 2021-03-21 DIAGNOSIS — L304 Erythema intertrigo: Secondary | ICD-10-CM | POA: Diagnosis not present

## 2021-03-21 DIAGNOSIS — I4891 Unspecified atrial fibrillation: Secondary | ICD-10-CM | POA: Diagnosis not present

## 2021-03-21 DIAGNOSIS — J9611 Chronic respiratory failure with hypoxia: Secondary | ICD-10-CM | POA: Diagnosis not present

## 2021-03-21 DIAGNOSIS — K219 Gastro-esophageal reflux disease without esophagitis: Secondary | ICD-10-CM | POA: Diagnosis not present

## 2021-03-21 DIAGNOSIS — Z9981 Dependence on supplemental oxygen: Secondary | ICD-10-CM | POA: Diagnosis not present

## 2021-03-21 DIAGNOSIS — F419 Anxiety disorder, unspecified: Secondary | ICD-10-CM | POA: Diagnosis not present

## 2021-03-21 DIAGNOSIS — I5020 Unspecified systolic (congestive) heart failure: Secondary | ICD-10-CM

## 2021-03-21 DIAGNOSIS — Z8589 Personal history of malignant neoplasm of other organs and systems: Secondary | ICD-10-CM | POA: Diagnosis not present

## 2021-03-21 DIAGNOSIS — I2699 Other pulmonary embolism without acute cor pulmonale: Secondary | ICD-10-CM | POA: Diagnosis not present

## 2021-03-21 DIAGNOSIS — K2100 Gastro-esophageal reflux disease with esophagitis, without bleeding: Secondary | ICD-10-CM | POA: Diagnosis not present

## 2021-03-21 DIAGNOSIS — R2689 Other abnormalities of gait and mobility: Secondary | ICD-10-CM | POA: Diagnosis not present

## 2021-03-21 DIAGNOSIS — Z7409 Other reduced mobility: Secondary | ICD-10-CM | POA: Diagnosis not present

## 2021-03-21 DIAGNOSIS — I480 Paroxysmal atrial fibrillation: Secondary | ICD-10-CM | POA: Diagnosis not present

## 2021-03-21 NOTE — Telephone Encounter (Signed)
Patient currently hospitalized in [facility]) for fall with subsequent finding new pulmonary embolus; also injured her lower extremity in fall. Has paperwork she needs me to fill out (FMLA). Was admitted July 6 and is set to go to rehab for about 10 days on discharge. Feeling better/. She had stopped eliquis due to vaginal bleeding and they currently have her on heparin but plan to restart and transition to coumadin. She estimates she will likely be able to go back to work mid August. I will put copy in chart. Minimal notes in care everywhere so I asked her to have them send me d/c summary via mail or fax.

## 2021-03-23 NOTE — Telephone Encounter (Signed)
I spoke with Alexandra Morales. She is in hospital. I filled out form and have faxed it,. Copy is in my box and will be scanned to chart.

## 2021-03-27 DIAGNOSIS — Z7409 Other reduced mobility: Secondary | ICD-10-CM | POA: Diagnosis not present

## 2021-03-27 DIAGNOSIS — I130 Hypertensive heart and chronic kidney disease with heart failure and stage 1 through stage 4 chronic kidney disease, or unspecified chronic kidney disease: Secondary | ICD-10-CM | POA: Diagnosis not present

## 2021-03-27 DIAGNOSIS — I2699 Other pulmonary embolism without acute cor pulmonale: Secondary | ICD-10-CM | POA: Diagnosis not present

## 2021-03-27 DIAGNOSIS — J9612 Chronic respiratory failure with hypercapnia: Secondary | ICD-10-CM | POA: Diagnosis not present

## 2021-03-30 ENCOUNTER — Other Ambulatory Visit: Payer: Self-pay | Admitting: Family Medicine

## 2021-04-04 DIAGNOSIS — I130 Hypertensive heart and chronic kidney disease with heart failure and stage 1 through stage 4 chronic kidney disease, or unspecified chronic kidney disease: Secondary | ICD-10-CM | POA: Diagnosis not present

## 2021-04-04 DIAGNOSIS — Z7409 Other reduced mobility: Secondary | ICD-10-CM | POA: Diagnosis not present

## 2021-04-04 DIAGNOSIS — M25511 Pain in right shoulder: Secondary | ICD-10-CM | POA: Diagnosis not present

## 2021-04-04 DIAGNOSIS — K2100 Gastro-esophageal reflux disease with esophagitis, without bleeding: Secondary | ICD-10-CM | POA: Diagnosis not present

## 2021-04-06 DIAGNOSIS — K2100 Gastro-esophageal reflux disease with esophagitis, without bleeding: Secondary | ICD-10-CM | POA: Diagnosis not present

## 2021-04-06 DIAGNOSIS — I130 Hypertensive heart and chronic kidney disease with heart failure and stage 1 through stage 4 chronic kidney disease, or unspecified chronic kidney disease: Secondary | ICD-10-CM | POA: Diagnosis not present

## 2021-04-06 DIAGNOSIS — Z7409 Other reduced mobility: Secondary | ICD-10-CM | POA: Diagnosis not present

## 2021-04-06 DIAGNOSIS — M25511 Pain in right shoulder: Secondary | ICD-10-CM | POA: Diagnosis not present

## 2021-04-09 ENCOUNTER — Encounter: Payer: Self-pay | Admitting: Family Medicine

## 2021-04-09 DIAGNOSIS — C541 Malignant neoplasm of endometrium: Secondary | ICD-10-CM | POA: Diagnosis not present

## 2021-04-09 DIAGNOSIS — K219 Gastro-esophageal reflux disease without esophagitis: Secondary | ICD-10-CM | POA: Diagnosis not present

## 2021-04-09 DIAGNOSIS — F419 Anxiety disorder, unspecified: Secondary | ICD-10-CM | POA: Diagnosis not present

## 2021-04-09 DIAGNOSIS — S31821D Laceration without foreign body of left buttock, subsequent encounter: Secondary | ICD-10-CM | POA: Diagnosis not present

## 2021-04-09 DIAGNOSIS — R32 Unspecified urinary incontinence: Secondary | ICD-10-CM | POA: Diagnosis not present

## 2021-04-09 DIAGNOSIS — D509 Iron deficiency anemia, unspecified: Secondary | ICD-10-CM | POA: Diagnosis not present

## 2021-04-09 DIAGNOSIS — I130 Hypertensive heart and chronic kidney disease with heart failure and stage 1 through stage 4 chronic kidney disease, or unspecified chronic kidney disease: Secondary | ICD-10-CM | POA: Diagnosis not present

## 2021-04-09 DIAGNOSIS — N1831 Chronic kidney disease, stage 3a: Secondary | ICD-10-CM | POA: Diagnosis not present

## 2021-04-09 DIAGNOSIS — J9611 Chronic respiratory failure with hypoxia: Secondary | ICD-10-CM | POA: Diagnosis not present

## 2021-04-09 DIAGNOSIS — I2699 Other pulmonary embolism without acute cor pulmonale: Secondary | ICD-10-CM | POA: Diagnosis not present

## 2021-04-09 DIAGNOSIS — I480 Paroxysmal atrial fibrillation: Secondary | ICD-10-CM | POA: Diagnosis not present

## 2021-04-09 DIAGNOSIS — I5022 Chronic systolic (congestive) heart failure: Secondary | ICD-10-CM | POA: Diagnosis not present

## 2021-04-09 DIAGNOSIS — L89892 Pressure ulcer of other site, stage 2: Secondary | ICD-10-CM | POA: Diagnosis not present

## 2021-04-09 DIAGNOSIS — F32A Depression, unspecified: Secondary | ICD-10-CM | POA: Diagnosis not present

## 2021-04-09 DIAGNOSIS — I890 Lymphedema, not elsewhere classified: Secondary | ICD-10-CM | POA: Diagnosis not present

## 2021-04-10 DIAGNOSIS — S31821D Laceration without foreign body of left buttock, subsequent encounter: Secondary | ICD-10-CM | POA: Diagnosis not present

## 2021-04-10 DIAGNOSIS — I10 Essential (primary) hypertension: Secondary | ICD-10-CM | POA: Diagnosis not present

## 2021-04-10 DIAGNOSIS — C541 Malignant neoplasm of endometrium: Secondary | ICD-10-CM | POA: Diagnosis not present

## 2021-04-10 DIAGNOSIS — L89892 Pressure ulcer of other site, stage 2: Secondary | ICD-10-CM | POA: Diagnosis not present

## 2021-04-10 DIAGNOSIS — Z7409 Other reduced mobility: Secondary | ICD-10-CM | POA: Diagnosis not present

## 2021-04-10 DIAGNOSIS — I130 Hypertensive heart and chronic kidney disease with heart failure and stage 1 through stage 4 chronic kidney disease, or unspecified chronic kidney disease: Secondary | ICD-10-CM | POA: Diagnosis not present

## 2021-04-10 DIAGNOSIS — I4891 Unspecified atrial fibrillation: Secondary | ICD-10-CM | POA: Diagnosis not present

## 2021-04-10 DIAGNOSIS — N1831 Chronic kidney disease, stage 3a: Secondary | ICD-10-CM | POA: Diagnosis not present

## 2021-04-10 DIAGNOSIS — E669 Obesity, unspecified: Secondary | ICD-10-CM | POA: Diagnosis not present

## 2021-04-10 DIAGNOSIS — I5020 Unspecified systolic (congestive) heart failure: Secondary | ICD-10-CM | POA: Diagnosis not present

## 2021-04-10 DIAGNOSIS — I480 Paroxysmal atrial fibrillation: Secondary | ICD-10-CM | POA: Diagnosis not present

## 2021-04-10 DIAGNOSIS — I890 Lymphedema, not elsewhere classified: Secondary | ICD-10-CM | POA: Diagnosis not present

## 2021-04-10 DIAGNOSIS — I5022 Chronic systolic (congestive) heart failure: Secondary | ICD-10-CM | POA: Diagnosis not present

## 2021-04-10 DIAGNOSIS — Z7901 Long term (current) use of anticoagulants: Secondary | ICD-10-CM | POA: Diagnosis not present

## 2021-04-10 DIAGNOSIS — R32 Unspecified urinary incontinence: Secondary | ICD-10-CM | POA: Diagnosis not present

## 2021-04-10 DIAGNOSIS — D509 Iron deficiency anemia, unspecified: Secondary | ICD-10-CM | POA: Diagnosis not present

## 2021-04-10 DIAGNOSIS — K219 Gastro-esophageal reflux disease without esophagitis: Secondary | ICD-10-CM | POA: Diagnosis not present

## 2021-04-10 DIAGNOSIS — Z09 Encounter for follow-up examination after completed treatment for conditions other than malignant neoplasm: Secondary | ICD-10-CM | POA: Diagnosis not present

## 2021-04-10 DIAGNOSIS — F419 Anxiety disorder, unspecified: Secondary | ICD-10-CM | POA: Diagnosis not present

## 2021-04-10 DIAGNOSIS — F32A Depression, unspecified: Secondary | ICD-10-CM | POA: Diagnosis not present

## 2021-04-10 DIAGNOSIS — I2699 Other pulmonary embolism without acute cor pulmonale: Secondary | ICD-10-CM | POA: Diagnosis not present

## 2021-04-10 DIAGNOSIS — J9611 Chronic respiratory failure with hypoxia: Secondary | ICD-10-CM | POA: Diagnosis not present

## 2021-04-11 DIAGNOSIS — R32 Unspecified urinary incontinence: Secondary | ICD-10-CM | POA: Diagnosis not present

## 2021-04-11 DIAGNOSIS — F419 Anxiety disorder, unspecified: Secondary | ICD-10-CM | POA: Diagnosis not present

## 2021-04-11 DIAGNOSIS — F32A Depression, unspecified: Secondary | ICD-10-CM | POA: Diagnosis not present

## 2021-04-11 DIAGNOSIS — I2699 Other pulmonary embolism without acute cor pulmonale: Secondary | ICD-10-CM | POA: Diagnosis not present

## 2021-04-11 DIAGNOSIS — L89892 Pressure ulcer of other site, stage 2: Secondary | ICD-10-CM | POA: Diagnosis not present

## 2021-04-11 DIAGNOSIS — I890 Lymphedema, not elsewhere classified: Secondary | ICD-10-CM | POA: Diagnosis not present

## 2021-04-11 DIAGNOSIS — C541 Malignant neoplasm of endometrium: Secondary | ICD-10-CM | POA: Diagnosis not present

## 2021-04-11 DIAGNOSIS — D509 Iron deficiency anemia, unspecified: Secondary | ICD-10-CM | POA: Diagnosis not present

## 2021-04-11 DIAGNOSIS — N1831 Chronic kidney disease, stage 3a: Secondary | ICD-10-CM | POA: Diagnosis not present

## 2021-04-11 DIAGNOSIS — S31821D Laceration without foreign body of left buttock, subsequent encounter: Secondary | ICD-10-CM | POA: Diagnosis not present

## 2021-04-11 DIAGNOSIS — I130 Hypertensive heart and chronic kidney disease with heart failure and stage 1 through stage 4 chronic kidney disease, or unspecified chronic kidney disease: Secondary | ICD-10-CM | POA: Diagnosis not present

## 2021-04-11 DIAGNOSIS — J9611 Chronic respiratory failure with hypoxia: Secondary | ICD-10-CM | POA: Diagnosis not present

## 2021-04-11 DIAGNOSIS — K219 Gastro-esophageal reflux disease without esophagitis: Secondary | ICD-10-CM | POA: Diagnosis not present

## 2021-04-11 DIAGNOSIS — I480 Paroxysmal atrial fibrillation: Secondary | ICD-10-CM | POA: Diagnosis not present

## 2021-04-11 DIAGNOSIS — I5022 Chronic systolic (congestive) heart failure: Secondary | ICD-10-CM | POA: Diagnosis not present

## 2021-04-13 ENCOUNTER — Encounter: Payer: Self-pay | Admitting: Family Medicine

## 2021-04-13 DIAGNOSIS — S31821D Laceration without foreign body of left buttock, subsequent encounter: Secondary | ICD-10-CM | POA: Diagnosis not present

## 2021-04-13 DIAGNOSIS — I2699 Other pulmonary embolism without acute cor pulmonale: Secondary | ICD-10-CM | POA: Diagnosis not present

## 2021-04-13 DIAGNOSIS — I480 Paroxysmal atrial fibrillation: Secondary | ICD-10-CM | POA: Diagnosis not present

## 2021-04-13 DIAGNOSIS — L89892 Pressure ulcer of other site, stage 2: Secondary | ICD-10-CM | POA: Diagnosis not present

## 2021-04-13 DIAGNOSIS — I130 Hypertensive heart and chronic kidney disease with heart failure and stage 1 through stage 4 chronic kidney disease, or unspecified chronic kidney disease: Secondary | ICD-10-CM | POA: Diagnosis not present

## 2021-04-13 DIAGNOSIS — K219 Gastro-esophageal reflux disease without esophagitis: Secondary | ICD-10-CM | POA: Diagnosis not present

## 2021-04-13 DIAGNOSIS — I5022 Chronic systolic (congestive) heart failure: Secondary | ICD-10-CM | POA: Diagnosis not present

## 2021-04-13 DIAGNOSIS — I890 Lymphedema, not elsewhere classified: Secondary | ICD-10-CM | POA: Diagnosis not present

## 2021-04-13 DIAGNOSIS — C541 Malignant neoplasm of endometrium: Secondary | ICD-10-CM | POA: Diagnosis not present

## 2021-04-13 DIAGNOSIS — R32 Unspecified urinary incontinence: Secondary | ICD-10-CM | POA: Diagnosis not present

## 2021-04-13 DIAGNOSIS — J9611 Chronic respiratory failure with hypoxia: Secondary | ICD-10-CM | POA: Diagnosis not present

## 2021-04-13 DIAGNOSIS — F419 Anxiety disorder, unspecified: Secondary | ICD-10-CM | POA: Diagnosis not present

## 2021-04-13 DIAGNOSIS — D509 Iron deficiency anemia, unspecified: Secondary | ICD-10-CM | POA: Diagnosis not present

## 2021-04-13 DIAGNOSIS — N1831 Chronic kidney disease, stage 3a: Secondary | ICD-10-CM | POA: Diagnosis not present

## 2021-04-13 DIAGNOSIS — F32A Depression, unspecified: Secondary | ICD-10-CM | POA: Diagnosis not present

## 2021-04-13 NOTE — Progress Notes (Unsigned)
FMLA faxed yesterday Completed forms and faxed to [employer] for her disability today. Fax [phone] [email or web address] Phone [phone]

## 2021-04-16 DIAGNOSIS — I130 Hypertensive heart and chronic kidney disease with heart failure and stage 1 through stage 4 chronic kidney disease, or unspecified chronic kidney disease: Secondary | ICD-10-CM | POA: Diagnosis not present

## 2021-04-16 DIAGNOSIS — I480 Paroxysmal atrial fibrillation: Secondary | ICD-10-CM | POA: Diagnosis not present

## 2021-04-16 DIAGNOSIS — J9611 Chronic respiratory failure with hypoxia: Secondary | ICD-10-CM | POA: Diagnosis not present

## 2021-04-16 DIAGNOSIS — N1831 Chronic kidney disease, stage 3a: Secondary | ICD-10-CM | POA: Diagnosis not present

## 2021-04-16 DIAGNOSIS — I890 Lymphedema, not elsewhere classified: Secondary | ICD-10-CM | POA: Diagnosis not present

## 2021-04-16 DIAGNOSIS — K219 Gastro-esophageal reflux disease without esophagitis: Secondary | ICD-10-CM | POA: Diagnosis not present

## 2021-04-16 DIAGNOSIS — C541 Malignant neoplasm of endometrium: Secondary | ICD-10-CM | POA: Diagnosis not present

## 2021-04-16 DIAGNOSIS — F32A Depression, unspecified: Secondary | ICD-10-CM | POA: Diagnosis not present

## 2021-04-16 DIAGNOSIS — D509 Iron deficiency anemia, unspecified: Secondary | ICD-10-CM | POA: Diagnosis not present

## 2021-04-16 DIAGNOSIS — S31821D Laceration without foreign body of left buttock, subsequent encounter: Secondary | ICD-10-CM | POA: Diagnosis not present

## 2021-04-16 DIAGNOSIS — I2699 Other pulmonary embolism without acute cor pulmonale: Secondary | ICD-10-CM | POA: Diagnosis not present

## 2021-04-16 DIAGNOSIS — I5022 Chronic systolic (congestive) heart failure: Secondary | ICD-10-CM | POA: Diagnosis not present

## 2021-04-16 DIAGNOSIS — L89892 Pressure ulcer of other site, stage 2: Secondary | ICD-10-CM | POA: Diagnosis not present

## 2021-04-16 DIAGNOSIS — F419 Anxiety disorder, unspecified: Secondary | ICD-10-CM | POA: Diagnosis not present

## 2021-04-16 DIAGNOSIS — R32 Unspecified urinary incontinence: Secondary | ICD-10-CM | POA: Diagnosis not present

## 2021-04-18 DIAGNOSIS — K219 Gastro-esophageal reflux disease without esophagitis: Secondary | ICD-10-CM | POA: Diagnosis not present

## 2021-04-18 DIAGNOSIS — I480 Paroxysmal atrial fibrillation: Secondary | ICD-10-CM | POA: Diagnosis not present

## 2021-04-18 DIAGNOSIS — I890 Lymphedema, not elsewhere classified: Secondary | ICD-10-CM | POA: Diagnosis not present

## 2021-04-18 DIAGNOSIS — C541 Malignant neoplasm of endometrium: Secondary | ICD-10-CM | POA: Diagnosis not present

## 2021-04-18 DIAGNOSIS — I130 Hypertensive heart and chronic kidney disease with heart failure and stage 1 through stage 4 chronic kidney disease, or unspecified chronic kidney disease: Secondary | ICD-10-CM | POA: Diagnosis not present

## 2021-04-18 DIAGNOSIS — R32 Unspecified urinary incontinence: Secondary | ICD-10-CM | POA: Diagnosis not present

## 2021-04-18 DIAGNOSIS — I5022 Chronic systolic (congestive) heart failure: Secondary | ICD-10-CM | POA: Diagnosis not present

## 2021-04-18 DIAGNOSIS — F32A Depression, unspecified: Secondary | ICD-10-CM | POA: Diagnosis not present

## 2021-04-18 DIAGNOSIS — S31821D Laceration without foreign body of left buttock, subsequent encounter: Secondary | ICD-10-CM | POA: Diagnosis not present

## 2021-04-18 DIAGNOSIS — D509 Iron deficiency anemia, unspecified: Secondary | ICD-10-CM | POA: Diagnosis not present

## 2021-04-18 DIAGNOSIS — I2699 Other pulmonary embolism without acute cor pulmonale: Secondary | ICD-10-CM | POA: Diagnosis not present

## 2021-04-18 DIAGNOSIS — L89892 Pressure ulcer of other site, stage 2: Secondary | ICD-10-CM | POA: Diagnosis not present

## 2021-04-18 DIAGNOSIS — F419 Anxiety disorder, unspecified: Secondary | ICD-10-CM | POA: Diagnosis not present

## 2021-04-18 DIAGNOSIS — J9611 Chronic respiratory failure with hypoxia: Secondary | ICD-10-CM | POA: Diagnosis not present

## 2021-04-18 DIAGNOSIS — N1831 Chronic kidney disease, stage 3a: Secondary | ICD-10-CM | POA: Diagnosis not present

## 2021-04-23 DIAGNOSIS — L89892 Pressure ulcer of other site, stage 2: Secondary | ICD-10-CM | POA: Diagnosis not present

## 2021-04-23 DIAGNOSIS — J9611 Chronic respiratory failure with hypoxia: Secondary | ICD-10-CM | POA: Diagnosis not present

## 2021-04-23 DIAGNOSIS — N1831 Chronic kidney disease, stage 3a: Secondary | ICD-10-CM | POA: Diagnosis not present

## 2021-04-23 DIAGNOSIS — I130 Hypertensive heart and chronic kidney disease with heart failure and stage 1 through stage 4 chronic kidney disease, or unspecified chronic kidney disease: Secondary | ICD-10-CM | POA: Diagnosis not present

## 2021-04-23 DIAGNOSIS — D509 Iron deficiency anemia, unspecified: Secondary | ICD-10-CM | POA: Diagnosis not present

## 2021-04-23 DIAGNOSIS — I480 Paroxysmal atrial fibrillation: Secondary | ICD-10-CM | POA: Diagnosis not present

## 2021-04-23 DIAGNOSIS — C541 Malignant neoplasm of endometrium: Secondary | ICD-10-CM | POA: Diagnosis not present

## 2021-04-23 DIAGNOSIS — I5022 Chronic systolic (congestive) heart failure: Secondary | ICD-10-CM | POA: Diagnosis not present

## 2021-04-23 DIAGNOSIS — K219 Gastro-esophageal reflux disease without esophagitis: Secondary | ICD-10-CM | POA: Diagnosis not present

## 2021-04-23 DIAGNOSIS — I890 Lymphedema, not elsewhere classified: Secondary | ICD-10-CM | POA: Diagnosis not present

## 2021-04-23 DIAGNOSIS — I2699 Other pulmonary embolism without acute cor pulmonale: Secondary | ICD-10-CM | POA: Diagnosis not present

## 2021-04-23 DIAGNOSIS — R32 Unspecified urinary incontinence: Secondary | ICD-10-CM | POA: Diagnosis not present

## 2021-04-23 DIAGNOSIS — F419 Anxiety disorder, unspecified: Secondary | ICD-10-CM | POA: Diagnosis not present

## 2021-04-23 DIAGNOSIS — F32A Depression, unspecified: Secondary | ICD-10-CM | POA: Diagnosis not present

## 2021-04-23 DIAGNOSIS — S31821D Laceration without foreign body of left buttock, subsequent encounter: Secondary | ICD-10-CM | POA: Diagnosis not present

## 2021-05-01 DIAGNOSIS — L89892 Pressure ulcer of other site, stage 2: Secondary | ICD-10-CM | POA: Diagnosis not present

## 2021-05-01 DIAGNOSIS — I480 Paroxysmal atrial fibrillation: Secondary | ICD-10-CM | POA: Diagnosis not present

## 2021-05-01 DIAGNOSIS — I5022 Chronic systolic (congestive) heart failure: Secondary | ICD-10-CM | POA: Diagnosis not present

## 2021-05-01 DIAGNOSIS — R32 Unspecified urinary incontinence: Secondary | ICD-10-CM | POA: Diagnosis not present

## 2021-05-01 DIAGNOSIS — F419 Anxiety disorder, unspecified: Secondary | ICD-10-CM | POA: Diagnosis not present

## 2021-05-01 DIAGNOSIS — N1831 Chronic kidney disease, stage 3a: Secondary | ICD-10-CM | POA: Diagnosis not present

## 2021-05-01 DIAGNOSIS — F32A Depression, unspecified: Secondary | ICD-10-CM | POA: Diagnosis not present

## 2021-05-01 DIAGNOSIS — K219 Gastro-esophageal reflux disease without esophagitis: Secondary | ICD-10-CM | POA: Diagnosis not present

## 2021-05-01 DIAGNOSIS — S31821D Laceration without foreign body of left buttock, subsequent encounter: Secondary | ICD-10-CM | POA: Diagnosis not present

## 2021-05-01 DIAGNOSIS — D509 Iron deficiency anemia, unspecified: Secondary | ICD-10-CM | POA: Diagnosis not present

## 2021-05-01 DIAGNOSIS — I130 Hypertensive heart and chronic kidney disease with heart failure and stage 1 through stage 4 chronic kidney disease, or unspecified chronic kidney disease: Secondary | ICD-10-CM | POA: Diagnosis not present

## 2021-05-01 DIAGNOSIS — I2699 Other pulmonary embolism without acute cor pulmonale: Secondary | ICD-10-CM | POA: Diagnosis not present

## 2021-05-01 DIAGNOSIS — I890 Lymphedema, not elsewhere classified: Secondary | ICD-10-CM | POA: Diagnosis not present

## 2021-05-01 DIAGNOSIS — J9611 Chronic respiratory failure with hypoxia: Secondary | ICD-10-CM | POA: Diagnosis not present

## 2021-05-01 DIAGNOSIS — C541 Malignant neoplasm of endometrium: Secondary | ICD-10-CM | POA: Diagnosis not present

## 2021-05-02 DIAGNOSIS — J9611 Chronic respiratory failure with hypoxia: Secondary | ICD-10-CM | POA: Diagnosis not present

## 2021-05-02 DIAGNOSIS — I5020 Unspecified systolic (congestive) heart failure: Secondary | ICD-10-CM | POA: Diagnosis not present

## 2021-05-02 DIAGNOSIS — D509 Iron deficiency anemia, unspecified: Secondary | ICD-10-CM | POA: Diagnosis not present

## 2021-05-02 DIAGNOSIS — E669 Obesity, unspecified: Secondary | ICD-10-CM | POA: Diagnosis not present

## 2021-05-10 ENCOUNTER — Other Ambulatory Visit: Payer: Self-pay | Admitting: Family Medicine

## 2021-05-11 DIAGNOSIS — D509 Iron deficiency anemia, unspecified: Secondary | ICD-10-CM | POA: Diagnosis not present

## 2021-05-11 DIAGNOSIS — E785 Hyperlipidemia, unspecified: Secondary | ICD-10-CM | POA: Diagnosis not present

## 2021-05-11 DIAGNOSIS — R0902 Hypoxemia: Secondary | ICD-10-CM | POA: Diagnosis not present

## 2021-05-11 DIAGNOSIS — I480 Paroxysmal atrial fibrillation: Secondary | ICD-10-CM | POA: Diagnosis not present

## 2021-05-11 DIAGNOSIS — J810 Acute pulmonary edema: Secondary | ICD-10-CM | POA: Diagnosis not present

## 2021-05-11 DIAGNOSIS — Z86711 Personal history of pulmonary embolism: Secondary | ICD-10-CM | POA: Diagnosis not present

## 2021-05-11 DIAGNOSIS — I420 Dilated cardiomyopathy: Secondary | ICD-10-CM | POA: Diagnosis not present

## 2021-05-11 DIAGNOSIS — I5022 Chronic systolic (congestive) heart failure: Secondary | ICD-10-CM | POA: Diagnosis not present

## 2021-05-11 DIAGNOSIS — I132 Hypertensive heart and chronic kidney disease with heart failure and with stage 5 chronic kidney disease, or end stage renal disease: Secondary | ICD-10-CM | POA: Diagnosis not present

## 2021-05-11 DIAGNOSIS — Z20822 Contact with and (suspected) exposure to covid-19: Secondary | ICD-10-CM | POA: Diagnosis not present

## 2021-05-11 DIAGNOSIS — R0602 Shortness of breath: Secondary | ICD-10-CM | POA: Diagnosis not present

## 2021-05-11 DIAGNOSIS — F40240 Claustrophobia: Secondary | ICD-10-CM | POA: Diagnosis not present

## 2021-05-11 DIAGNOSIS — I4891 Unspecified atrial fibrillation: Secondary | ICD-10-CM | POA: Diagnosis not present

## 2021-05-11 DIAGNOSIS — Z6841 Body Mass Index (BMI) 40.0 and over, adult: Secondary | ICD-10-CM | POA: Diagnosis not present

## 2021-05-11 DIAGNOSIS — I5023 Acute on chronic systolic (congestive) heart failure: Secondary | ICD-10-CM | POA: Diagnosis not present

## 2021-05-11 DIAGNOSIS — I130 Hypertensive heart and chronic kidney disease with heart failure and stage 1 through stage 4 chronic kidney disease, or unspecified chronic kidney disease: Secondary | ICD-10-CM | POA: Diagnosis not present

## 2021-05-11 DIAGNOSIS — T502X6A Underdosing of carbonic-anhydrase inhibitors, benzothiadiazides and other diuretics, initial encounter: Secondary | ICD-10-CM | POA: Diagnosis not present

## 2021-05-11 DIAGNOSIS — J9621 Acute and chronic respiratory failure with hypoxia: Secondary | ICD-10-CM | POA: Diagnosis not present

## 2021-05-11 DIAGNOSIS — R739 Hyperglycemia, unspecified: Secondary | ICD-10-CM | POA: Diagnosis not present

## 2021-05-11 DIAGNOSIS — R059 Cough, unspecified: Secondary | ICD-10-CM | POA: Diagnosis not present

## 2021-05-11 DIAGNOSIS — N1831 Chronic kidney disease, stage 3a: Secondary | ICD-10-CM | POA: Diagnosis not present

## 2021-05-11 DIAGNOSIS — D6859 Other primary thrombophilia: Secondary | ICD-10-CM | POA: Diagnosis not present

## 2021-05-17 DIAGNOSIS — D509 Iron deficiency anemia, unspecified: Secondary | ICD-10-CM | POA: Diagnosis not present

## 2021-05-17 DIAGNOSIS — F419 Anxiety disorder, unspecified: Secondary | ICD-10-CM | POA: Diagnosis not present

## 2021-05-17 DIAGNOSIS — I2699 Other pulmonary embolism without acute cor pulmonale: Secondary | ICD-10-CM | POA: Diagnosis not present

## 2021-05-17 DIAGNOSIS — C541 Malignant neoplasm of endometrium: Secondary | ICD-10-CM | POA: Diagnosis not present

## 2021-05-17 DIAGNOSIS — I5022 Chronic systolic (congestive) heart failure: Secondary | ICD-10-CM | POA: Diagnosis not present

## 2021-05-17 DIAGNOSIS — J9621 Acute and chronic respiratory failure with hypoxia: Secondary | ICD-10-CM | POA: Diagnosis not present

## 2021-05-17 DIAGNOSIS — F32A Depression, unspecified: Secondary | ICD-10-CM | POA: Diagnosis not present

## 2021-05-17 DIAGNOSIS — J9611 Chronic respiratory failure with hypoxia: Secondary | ICD-10-CM | POA: Diagnosis not present

## 2021-05-17 DIAGNOSIS — R32 Unspecified urinary incontinence: Secondary | ICD-10-CM | POA: Diagnosis not present

## 2021-05-17 DIAGNOSIS — I130 Hypertensive heart and chronic kidney disease with heart failure and stage 1 through stage 4 chronic kidney disease, or unspecified chronic kidney disease: Secondary | ICD-10-CM | POA: Diagnosis not present

## 2021-05-17 DIAGNOSIS — L89892 Pressure ulcer of other site, stage 2: Secondary | ICD-10-CM | POA: Diagnosis not present

## 2021-05-17 DIAGNOSIS — S31821D Laceration without foreign body of left buttock, subsequent encounter: Secondary | ICD-10-CM | POA: Diagnosis not present

## 2021-05-17 DIAGNOSIS — N1831 Chronic kidney disease, stage 3a: Secondary | ICD-10-CM | POA: Diagnosis not present

## 2021-05-17 DIAGNOSIS — I890 Lymphedema, not elsewhere classified: Secondary | ICD-10-CM | POA: Diagnosis not present

## 2021-05-17 DIAGNOSIS — I5023 Acute on chronic systolic (congestive) heart failure: Secondary | ICD-10-CM | POA: Diagnosis not present

## 2021-05-17 DIAGNOSIS — I480 Paroxysmal atrial fibrillation: Secondary | ICD-10-CM | POA: Diagnosis not present

## 2021-05-17 DIAGNOSIS — K219 Gastro-esophageal reflux disease without esophagitis: Secondary | ICD-10-CM | POA: Diagnosis not present

## 2021-05-22 ENCOUNTER — Encounter: Payer: Self-pay | Admitting: Family Medicine

## 2021-05-30 ENCOUNTER — Encounter: Payer: Self-pay | Admitting: Family Medicine

## 2021-05-30 NOTE — Progress Notes (Unsigned)
Filled out FMLA paperwork for her recent hospitalization. D/c meds are:   .   START taking these medications  docusate sodium 100 MG capsule Commonly known as: COLACE Dose: 100 mg Instructions: Take 1 capsule (100 mg total) by mouth 2 (two) times daily as needed for up to 30 days for Constipation.  lisinopriL 5 MG tablet Commonly known as: PRINIVIL,ZESTRIL Dose: 5 mg Instructions: Take 1 tablet (5 mg total) by mouth daily.   CHANGE how you take these medications  warfarin 1 MG tablet *ANTICOAGULANT* Commonly known as: COUMADIN What changed:   medication strength  how much to take  how to take this  when to take this  additional instructions  Another medication with the same name was removed. Continue taking this medication, and follow the directions you see here. Dose: 7.5 mg Instructions: Take 7.5 tablets (7.5 mg total) by mouth daily at 6pm for 30 days. Indication: treatment to prevent blood clots in chronic atrial fibrillation   CONTINUE taking these medications  albuterol 90 mcg/actuation inhaler Dose: 2 puff Instructions: Inhale 2 puffs into the lungs every 6 (six) hours as needed for up to 30 days for Wheezing.  ascorbic acid (vitamin C) 500 MG tablet Commonly known as: VITAMIN C Dose: 500 mg Instructions: Take 1 tablet (500 mg total) by mouth 2 times daily.  atorvastatin 20 MG tablet Commonly known as: LIPITOR Dose: 20 mg Instructions: Take 1 tablet (20 mg total) by mouth daily at 6pm for 30 days.  bumetanide 1 MG tablet Commonly known as: BUMEX Dose: 1 mg Instructions: Take 1 tablet (1 mg total) by mouth daily.  busPIRone 10 MG tablet Commonly known as: BUSPAR Dose: 10 mg Instructions: Take 10 mg by mouth 2 times daily.  carvediloL 12.5 MG tablet Commonly known as: COREG Dose: 12.5 mg Instructions: Take 1 tablet (12.5 mg total) by mouth 2 times daily with meals for 30 days.  diazePAM 5 MG tablet Commonly known as: VALIUM Dose: 5  mg Instructions: Take 1 tablet (5 mg total) by mouth every 12 (twelve) hours as needed for Anxiety.  FLUoxetine 40 MG capsule Commonly known as: PROzac Dose: 40 mg Instructions: Take 40 mg by mouth daily.  iron polysaccharides 150 mg iron capsule Commonly known as: NU-IRON Dose: 150 mg Instructions: Take 1 capsule (150 mg total) by mouth daily.  megestroL 20 MG tablet Commonly known as: MEGACE Dose: 20 mg Instructions: Take 20 mg by mouth 2 times daily. Patient stated she takes 20mg  BID usually. When she is on her period she takes 4 tablets per day (80 mg).  pantoprazole 40 MG tablet Commonly known as: PROTONIX Dose: 40 mg Instructions: Take 1 tablet (40 mg total) by mouth 2 times daily.  potassium chloride ER 10 MEQ extended release tablet Commonly known as: KLOR-CON-M, K-DUR Dose: 10 mEq Instructions: Take 1 tablet (10 mEq total) by mouth daily for 30 days.   STOP taking these medications  buPROPion XL 150 MG 24 hr tablet Commonly known as: WELLBUTRIN XL  metoPROLOL succinate 100 MG 24 hr tablet Commonly known as: TOPROL-XL

## 2021-06-06 ENCOUNTER — Telehealth (INDEPENDENT_AMBULATORY_CARE_PROVIDER_SITE_OTHER): Payer: BC Managed Care – PPO | Admitting: Family Medicine

## 2021-06-06 ENCOUNTER — Other Ambulatory Visit: Payer: Self-pay

## 2021-06-06 DIAGNOSIS — I2699 Other pulmonary embolism without acute cor pulmonale: Secondary | ICD-10-CM

## 2021-06-20 ENCOUNTER — Telehealth (INDEPENDENT_AMBULATORY_CARE_PROVIDER_SITE_OTHER): Payer: BC Managed Care – PPO | Admitting: Family Medicine

## 2021-06-20 ENCOUNTER — Other Ambulatory Visit: Payer: Self-pay

## 2021-06-20 ENCOUNTER — Other Ambulatory Visit: Payer: Self-pay | Admitting: Family Medicine

## 2021-06-20 ENCOUNTER — Encounter: Payer: Self-pay | Admitting: Family Medicine

## 2021-06-20 DIAGNOSIS — I2699 Other pulmonary embolism without acute cor pulmonale: Secondary | ICD-10-CM

## 2021-06-20 NOTE — Progress Notes (Signed)
Patient had to reschedule as doctor was running behind schedule

## 2021-06-22 MED ORDER — CARVEDILOL 12.5 MG PO TABS: 12.5000 mg | ORAL_TABLET | Freq: Two times a day (BID) | ORAL | 3 refills | Status: DC

## 2021-06-22 NOTE — Progress Notes (Signed)
Patient had to leave before we could do telemed visit and will rescheuld

## 2021-06-26 ENCOUNTER — Other Ambulatory Visit: Payer: Self-pay | Admitting: Family Medicine

## 2021-06-26 DIAGNOSIS — C541 Malignant neoplasm of endometrium: Secondary | ICD-10-CM

## 2021-06-26 MED ORDER — MEGESTROL ACETATE 40 MG PO TABS: ORAL_TABLET | ORAL | 1 refills | Status: DC

## 2021-07-29 ENCOUNTER — Other Ambulatory Visit: Payer: Self-pay | Admitting: Family Medicine

## 2021-09-13 ENCOUNTER — Encounter: Payer: Self-pay | Admitting: Family Medicine

## 2021-10-24 ENCOUNTER — Telehealth (INDEPENDENT_AMBULATORY_CARE_PROVIDER_SITE_OTHER): Payer: BC Managed Care – PPO | Admitting: Family Medicine

## 2021-10-24 ENCOUNTER — Other Ambulatory Visit: Payer: Self-pay

## 2021-10-24 DIAGNOSIS — I2699 Other pulmonary embolism without acute cor pulmonale: Secondary | ICD-10-CM

## 2021-10-24 DIAGNOSIS — C541 Malignant neoplasm of endometrium: Secondary | ICD-10-CM | POA: Diagnosis not present

## 2021-10-25 ENCOUNTER — Encounter: Payer: Self-pay | Admitting: Family Medicine

## 2021-10-25 NOTE — Progress Notes (Signed)
[facility] ?Telemedicine Visit ? ?Patient consented to have virtual visit and was identified by name and date of birth. ?Method of visit: Video was attempted but was interrupted: <50% of visit completed via video ? ?Encounter participants: ?Patient: Alexandra Morales - located at [facility] center ?Provider: [name] - located at office ?Others (if applicable): none ? ?Chief Complaint: Follow-up recent hospitalization at [facility].  She was admitted on February 1.  Has now been discharged to [facility] rehabilitation center and she will be there for the next few weeks while she tries to regain strength.  Issues were vaginal bleeding requiring transfusion therapy, reevaluation of endometrial cancer, pneumonia, chronic anticoagulation.  During this time she also was in the ICU for a while and developed a nerve palsy in the left upper extremity.  She continues to struggle with that.  She is starting to be able to open and close her hand but still cannot supinate the forearm on the left.  Fortunately she is right-hand dominant.  She bled down to a hemoglobin of 5.5 requiring transfusion. ? ? ? ?ROS: per HPI ? ?Pertinent PMHx: Review of hospitalization starting February 1. ?Initial discharge October 06, 2021.  Diagnoses include acute blood loss anemia, vaginal bleeding secondary to endometrial cancer, hypertension.  Additional diagnoses include acute kidney injury, iron deficiency anemia, heart failure unspecified, hypertension, morbid obesity, chronic kidney disease.  Procedures during the hospitalization included an exam under anesthesia.  She was in the medical intensive care unit secondary to hypotension for a portion of this hospitalization and also required vasopressors. ? ?She was then transferred to [facility] for further evaluation and management on October 06, 2021.  She was hospitalized at [facility] from February 11 was discharged on  October 12 2021.  On the 17th she was transferred to rehab facility [facility] rehabilitation. ? ?During her stay at [facility] diagnoses included vaginal bleeding, long-term use of anticoagulant, heart failure with reduced ejection fraction, atrial fibrillation with RVR, morbid obesity, endometrial cancer,History of pulmonary embolus and infarction and stage IIIa chronic kidney disease.  Procedures during the hospitalization include dilation and curettage of uterine lining, removal of LNG IUD with reinsertion of LNG IUD. ? ?Pathology of D&C/IUD replacement under general anesthesia occluded following report: Markedly decidualized endometrium consistent with exogenous hormone therapy in a background of abundant blood.  No endometrial epithelial neoplasia or carcinoma identified.  Patient did not receive brachytherapy as was planned secondary to the benign nature of pathology of endometrial tissue sampling. ?Discharge to skilled nursing facility [facility] rehabilitation. ? ?Exam:  ?There were no vitals taken for this visit.  ?Respiratory: No shortness of breath.  She is currently on supplemental oxygen. ?PSYCH: AxOx4. Good eye contact.. No psychomotor retardation or agitation. Appropriate speech fluency and content. Asks and answers questions appropriately. Mood is congruent. ? ? ?Assessment/Plan: ?Hospital follow-up.  Patient currently in rehabilitation facility.  She wants me to try to fill out her FMLA and disability paperwork.  Given the fact that she was hospitalized at [facility] and then at [facility], neither of  which is in the same system that we are in, I cannot access those records to send with her paperwork.  I can access only to review them. Likely, she will have to do a separate release of records for them.  I will go ahead and fill out as much as I can fax it in.  I would  like to follow-up with her by telehealth or other option in the next 4 weeks.  I do think it  is reasonable for her to potentially be able to return to work [address]. ?No problem-specific Assessment & Plan notes found for this encounter. ?  ? ?Time spent during visit with patient: 79minutes  ?Time spent in record review and filling out forms, additional 30 minutes ?

## 2021-10-25 NOTE — Assessment & Plan Note (Signed)
We will continue to need long-term anticoagulation.  She has been placed back on Eliquis. ?

## 2021-10-25 NOTE — Assessment & Plan Note (Signed)
Hospitalized [facility] read all service and had reevaluation with benign findings of endometrium.  D&C.  Replacement of LNG IUD. ?

## 2021-11-16 ENCOUNTER — Encounter: Payer: Self-pay | Admitting: Family Medicine

## 2021-12-07 ENCOUNTER — Encounter: Payer: Self-pay | Admitting: Family Medicine

## 2021-12-19 ENCOUNTER — Encounter: Payer: Self-pay | Admitting: Family Medicine

## 2021-12-19 ENCOUNTER — Telehealth (INDEPENDENT_AMBULATORY_CARE_PROVIDER_SITE_OTHER): Payer: BC Managed Care – PPO | Admitting: Family Medicine

## 2021-12-19 DIAGNOSIS — J9611 Chronic respiratory failure with hypoxia: Secondary | ICD-10-CM

## 2021-12-19 DIAGNOSIS — Z9981 Dependence on supplemental oxygen: Secondary | ICD-10-CM

## 2021-12-20 NOTE — Progress Notes (Signed)
Did not show for telehealth appt. I have tried to call several times. Looking in [place] looks like she may be back in hospital / NH. ?

## 2022-01-29 ENCOUNTER — Encounter: Payer: Self-pay | Admitting: *Deleted

## 2022-08-13 ENCOUNTER — Encounter: Payer: Self-pay | Admitting: Family Medicine

## 2022-08-21 ENCOUNTER — Telehealth: Payer: Self-pay

## 2022-08-21 NOTE — Telephone Encounter (Signed)
Transition Care Management Unsuccessful Follow-up Telephone Call  Date of discharge and from where:  [facility] 08/20/2022  Attempts:  1st Attempt  Reason for unsuccessful TCM follow-up call:  Left voice message Juanda Crumble, [place] Direct Dial [phone]

## 2022-08-22 NOTE — Telephone Encounter (Signed)
Transition Care Management Unsuccessful Follow-up Telephone Call  Date of discharge and from where:  [facility] 1226/2023  Attempts:  2nd Attempt  Reason for unsuccessful TCM follow-up call:  Left voice message Juanda Crumble, [place] Direct Dial [phone]

## 2022-08-23 NOTE — Telephone Encounter (Signed)
Transition Care Management Unsuccessful Follow-up Telephone Call  Date of discharge and from where:  [facility] 08/20/2022  Attempts:  3rd Attempt  Reason for unsuccessful TCM follow-up call:  Left voice message Juanda Crumble, [place] Direct Dial [phone]

## 2022-09-19 ENCOUNTER — Encounter: Payer: Self-pay | Admitting: Family Medicine

## 2023-01-10 ENCOUNTER — Telehealth: Payer: Self-pay

## 2023-01-10 NOTE — Transitions of Care (Post Inpatient/ED Visit) (Signed)
   01/10/2023  Name: Alexandra Morales MRN: [id number] DOB: [birth year]  Today's TOC FU Call Status: Today's TOC FU Call Status:: Unsuccessul Call (1st Attempt) Unsuccessful Call (1st Attempt) Date: 01/10/23  Attempted to reach the patient regarding the most recent Inpatient/ED visit.  Follow Up Plan: Additional outreach attempts will be made to reach the patient to complete the Transitions of Care (Post Inpatient/ED visit) call.   Signature Karena Addison, LPN [facility] Nurse Health Advisor Direct Dial [phone]

## 2023-01-15 NOTE — Transitions of Care (Post Inpatient/ED Visit) (Signed)
   01/15/2023  Name: Alexandra Morales MRN: [id number] DOB: 1971/04/27  Today's TOC FU Call Status: Today's TOC FU Call Status:: Unsuccessful Call (2nd Attempt) Unsuccessful Call (1st Attempt) Date: 01/10/23 Unsuccessful Call (2nd Attempt) Date: 01/15/23  Attempted to reach the patient regarding the most recent Inpatient/ED visit.  Follow Up Plan: Additional outreach attempts will be made to reach the patient to complete the Transitions of Care (Post Inpatient/ED visit) call.   Signature Karena Addison, LPN [facility] Nurse Health Advisor Direct Dial [phone]

## 2023-01-22 NOTE — Transitions of Care (Post Inpatient/ED Visit) (Signed)
   01/22/2023  Name: Alexandra Morales MRN: [id number] DOB: 03/27/1971  Today's TOC FU Call Status: Today's TOC FU Call Status:: Unsuccessful Call (3rd Attempt) Unsuccessful Call (1st Attempt) Date: 01/10/23 Unsuccessful Call (2nd Attempt) Date: 01/15/23 Unsuccessful Call (3rd Attempt) Date: 01/22/23  Attempted to reach the patient regarding the most recent Inpatient/ED visit.  Follow Up Plan: No further outreach attempts will be made at this time. We have been unable to contact the patient.  Signature Karena Addison, LPN [facility] Nurse Health Advisor Direct Dial [phone]

## 2023-01-25 DEATH — deceased
